# Patient Record
Sex: Male | Born: 1960 | Race: White | Hispanic: No | Marital: Married | Smoking: Current some day smoker
Health system: Southern US, Community
[De-identification: ages and names within clinical notes are randomized; demographics above are authoritative.]

## PROBLEM LIST (undated history)

## (undated) DIAGNOSIS — Z8711 Personal history of peptic ulcer disease: Secondary | ICD-10-CM

## (undated) DIAGNOSIS — I2510 Atherosclerotic heart disease of native coronary artery without angina pectoris: Secondary | ICD-10-CM

## (undated) DIAGNOSIS — E785 Hyperlipidemia, unspecified: Secondary | ICD-10-CM

## (undated) DIAGNOSIS — S3992XA Unspecified injury of lower back, initial encounter: Secondary | ICD-10-CM

## (undated) DIAGNOSIS — I10 Essential (primary) hypertension: Secondary | ICD-10-CM

## (undated) DIAGNOSIS — Z8719 Personal history of other diseases of the digestive system: Secondary | ICD-10-CM

## (undated) DIAGNOSIS — J449 Chronic obstructive pulmonary disease, unspecified: Secondary | ICD-10-CM

## (undated) DIAGNOSIS — R091 Pleurisy: Secondary | ICD-10-CM

## (undated) DIAGNOSIS — Z9889 Other specified postprocedural states: Secondary | ICD-10-CM

## (undated) DIAGNOSIS — I219 Acute myocardial infarction, unspecified: Secondary | ICD-10-CM

## (undated) DIAGNOSIS — M1990 Unspecified osteoarthritis, unspecified site: Secondary | ICD-10-CM

## (undated) DIAGNOSIS — Z955 Presence of coronary angioplasty implant and graft: Secondary | ICD-10-CM

## (undated) DIAGNOSIS — J439 Emphysema, unspecified: Secondary | ICD-10-CM

## (undated) HISTORY — DX: Atherosclerotic heart disease of native coronary artery without angina pectoris: I25.10

## (undated) HISTORY — PX: TOOTH EXTRACTION: SUR596

## (undated) HISTORY — PX: APPENDECTOMY: SHX54

## (undated) HISTORY — PX: TONSILLECTOMY: SUR1361

## (undated) HISTORY — PX: BACK SURGERY: SHX140

## (undated) HISTORY — DX: Unspecified injury of lower back, initial encounter: S39.92XA

## (undated) HISTORY — DX: Hyperlipidemia, unspecified: E78.5

## (undated) HISTORY — PX: COLONOSCOPY: SHX174

---

## 2005-05-20 DIAGNOSIS — Z955 Presence of coronary angioplasty implant and graft: Secondary | ICD-10-CM

## 2005-05-20 HISTORY — DX: Presence of coronary angioplasty implant and graft: Z95.5

## 2005-05-31 ENCOUNTER — Encounter (INDEPENDENT_AMBULATORY_CARE_PROVIDER_SITE_OTHER): Payer: Self-pay | Admitting: Specialist

## 2005-05-31 ENCOUNTER — Inpatient Hospital Stay (HOSPITAL_COMMUNITY): Admission: RE | Admit: 2005-05-31 | Discharge: 2005-06-02 | Payer: Self-pay | Admitting: Orthopedic Surgery

## 2005-09-26 ENCOUNTER — Emergency Department (HOSPITAL_COMMUNITY): Admission: EM | Admit: 2005-09-26 | Discharge: 2005-09-26 | Payer: Self-pay | Admitting: Emergency Medicine

## 2005-10-02 ENCOUNTER — Ambulatory Visit: Payer: Self-pay | Admitting: Cardiology

## 2005-10-02 ENCOUNTER — Inpatient Hospital Stay (HOSPITAL_COMMUNITY): Admission: EM | Admit: 2005-10-02 | Discharge: 2005-10-06 | Payer: Self-pay | Admitting: Cardiovascular Disease

## 2005-10-21 ENCOUNTER — Inpatient Hospital Stay (HOSPITAL_COMMUNITY): Admission: AD | Admit: 2005-10-21 | Discharge: 2005-10-23 | Payer: Self-pay | Admitting: Cardiology

## 2005-10-21 ENCOUNTER — Ambulatory Visit: Payer: Self-pay | Admitting: Cardiology

## 2005-11-05 ENCOUNTER — Ambulatory Visit: Payer: Self-pay | Admitting: Cardiology

## 2005-11-06 ENCOUNTER — Ambulatory Visit: Admission: RE | Admit: 2005-11-06 | Discharge: 2005-11-06 | Payer: Self-pay | Admitting: Gastroenterology

## 2005-11-06 ENCOUNTER — Ambulatory Visit: Payer: Self-pay | Admitting: Cardiology

## 2005-11-11 ENCOUNTER — Ambulatory Visit: Payer: Self-pay | Admitting: Gastroenterology

## 2005-11-25 ENCOUNTER — Ambulatory Visit: Payer: Self-pay | Admitting: Internal Medicine

## 2005-11-25 ENCOUNTER — Inpatient Hospital Stay (HOSPITAL_COMMUNITY): Admission: EM | Admit: 2005-11-25 | Discharge: 2005-11-26 | Payer: Self-pay | Admitting: Emergency Medicine

## 2006-01-21 ENCOUNTER — Ambulatory Visit: Payer: Self-pay

## 2006-02-06 ENCOUNTER — Ambulatory Visit: Payer: Self-pay | Admitting: Cardiology

## 2006-02-07 ENCOUNTER — Ambulatory Visit: Payer: Self-pay | Admitting: Cardiology

## 2006-02-21 ENCOUNTER — Ambulatory Visit (HOSPITAL_BASED_OUTPATIENT_CLINIC_OR_DEPARTMENT_OTHER): Admission: RE | Admit: 2006-02-21 | Discharge: 2006-02-21 | Payer: Self-pay | Admitting: *Deleted

## 2006-06-12 ENCOUNTER — Ambulatory Visit: Payer: Self-pay | Admitting: Cardiology

## 2006-07-21 ENCOUNTER — Ambulatory Visit (HOSPITAL_BASED_OUTPATIENT_CLINIC_OR_DEPARTMENT_OTHER): Admission: RE | Admit: 2006-07-21 | Discharge: 2006-07-21 | Payer: Self-pay | Admitting: Orthopaedic Surgery

## 2006-09-02 ENCOUNTER — Ambulatory Visit: Payer: Self-pay | Admitting: Cardiology

## 2006-09-02 LAB — CONVERTED CEMR LAB
Albumin: 3.8 g/dL (ref 3.5–5.2)
Alkaline Phosphatase: 65 units/L (ref 39–117)
LDL Cholesterol: 81 mg/dL (ref 0–99)
Total CHOL/HDL Ratio: 3.5
Total Protein: 7.1 g/dL (ref 6.0–8.3)
Triglycerides: 52 mg/dL (ref 0–149)

## 2006-10-09 ENCOUNTER — Inpatient Hospital Stay (HOSPITAL_COMMUNITY): Admission: RE | Admit: 2006-10-09 | Discharge: 2006-10-11 | Payer: Self-pay | Admitting: Orthopaedic Surgery

## 2006-12-19 ENCOUNTER — Ambulatory Visit: Payer: Self-pay | Admitting: Cardiology

## 2006-12-19 LAB — CONVERTED CEMR LAB
Basophils Absolute: 0 10*3/uL (ref 0.0–0.1)
MCV: 92.3 fL (ref 78.0–100.0)
Monocytes Absolute: 0.8 10*3/uL — ABNORMAL HIGH (ref 0.2–0.7)
Monocytes Relative: 7.7 % (ref 3.0–11.0)
Neutro Abs: 6.8 10*3/uL (ref 1.4–7.7)
Neutrophils Relative %: 65.1 % (ref 43.0–77.0)
RBC: 4.23 M/uL (ref 4.22–5.81)

## 2007-07-15 ENCOUNTER — Ambulatory Visit: Payer: Self-pay | Admitting: Cardiology

## 2007-07-17 ENCOUNTER — Ambulatory Visit: Payer: Self-pay | Admitting: Cardiology

## 2007-07-19 ENCOUNTER — Inpatient Hospital Stay (HOSPITAL_COMMUNITY): Admission: AD | Admit: 2007-07-19 | Discharge: 2007-07-21 | Payer: Self-pay | Admitting: Internal Medicine

## 2007-07-19 ENCOUNTER — Ambulatory Visit: Payer: Self-pay | Admitting: Internal Medicine

## 2007-09-28 ENCOUNTER — Ambulatory Visit: Payer: Self-pay | Admitting: Cardiology

## 2007-10-19 ENCOUNTER — Emergency Department (HOSPITAL_COMMUNITY): Admission: EM | Admit: 2007-10-19 | Discharge: 2007-10-19 | Payer: Self-pay | Admitting: Internal Medicine

## 2007-12-03 IMAGING — CR DG CHEST 1V PORT
1 series · 1 of 1 positions shown · non-contrast
Comparison: 09/26/2005.

CLINICAL DATA: Post-cath MI.
 PORTABLE CHEST, ONE VIEW ? 10/02/2005 ? (9857 HOURS):

[view not recorded]
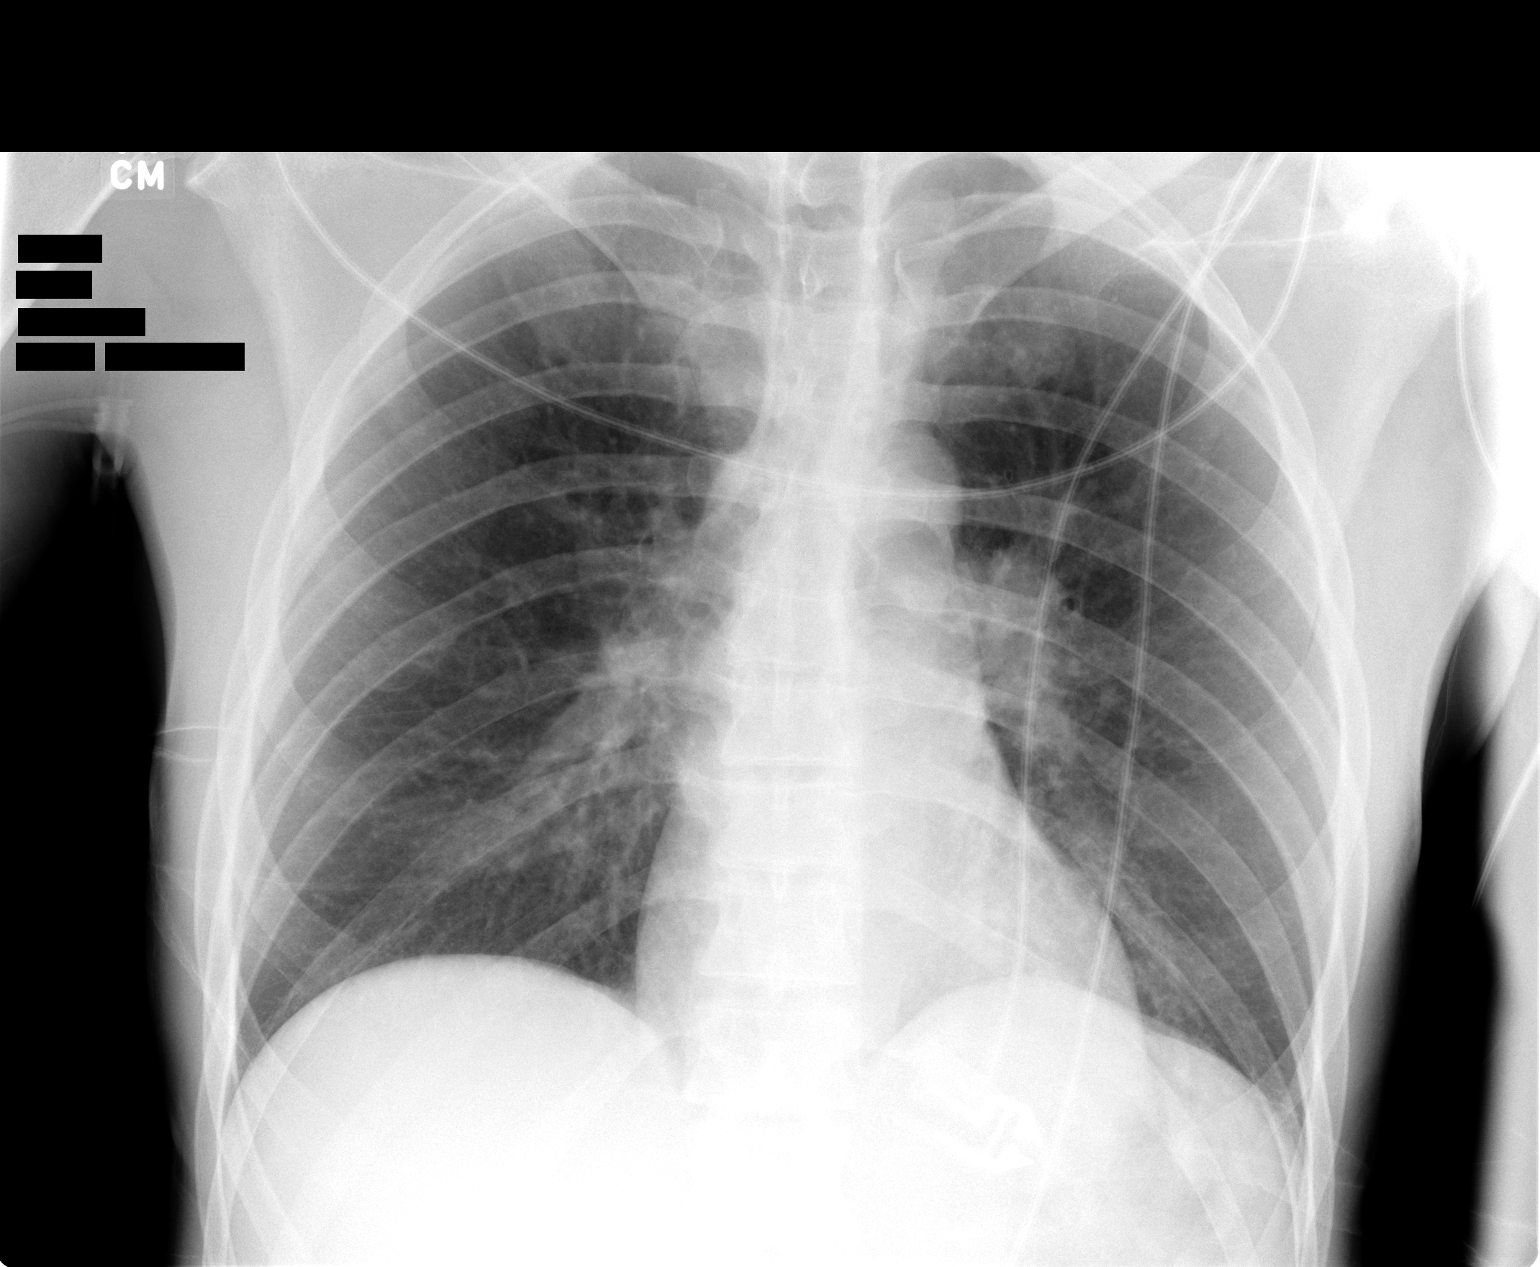

[1 of 1 positions shown; findings below may reference images not displayed]

FINDINGS: Portable view of the chest shows no active infiltrate or effusion.  The heart is within normal limits in size.  No bony abnormality is noted.
IMPRESSION: No active lung disease.

## 2008-10-25 ENCOUNTER — Telehealth: Payer: Self-pay | Admitting: Cardiology

## 2008-11-14 ENCOUNTER — Telehealth (INDEPENDENT_AMBULATORY_CARE_PROVIDER_SITE_OTHER): Payer: Self-pay | Admitting: *Deleted

## 2008-11-14 ENCOUNTER — Encounter (INDEPENDENT_AMBULATORY_CARE_PROVIDER_SITE_OTHER): Payer: Self-pay | Admitting: *Deleted

## 2008-12-09 DIAGNOSIS — F17200 Nicotine dependence, unspecified, uncomplicated: Secondary | ICD-10-CM

## 2008-12-09 DIAGNOSIS — S381XXA Crushing injury of abdomen, lower back, and pelvis, initial encounter: Secondary | ICD-10-CM

## 2008-12-09 DIAGNOSIS — I2510 Atherosclerotic heart disease of native coronary artery without angina pectoris: Secondary | ICD-10-CM

## 2008-12-09 DIAGNOSIS — E785 Hyperlipidemia, unspecified: Secondary | ICD-10-CM

## 2009-01-03 ENCOUNTER — Encounter: Payer: Self-pay | Admitting: Cardiology

## 2009-01-12 ENCOUNTER — Encounter: Payer: Self-pay | Admitting: Cardiology

## 2009-03-10 ENCOUNTER — Emergency Department (HOSPITAL_COMMUNITY): Admission: EM | Admit: 2009-03-10 | Discharge: 2009-03-11 | Payer: Self-pay | Admitting: Emergency Medicine

## 2009-04-21 ENCOUNTER — Telehealth: Payer: Self-pay | Admitting: Cardiology

## 2009-05-27 ENCOUNTER — Emergency Department (HOSPITAL_COMMUNITY): Admission: EM | Admit: 2009-05-27 | Discharge: 2009-05-27 | Payer: Self-pay | Admitting: Emergency Medicine

## 2009-07-10 ENCOUNTER — Telehealth: Payer: Self-pay | Admitting: Cardiology

## 2009-08-10 ENCOUNTER — Ambulatory Visit: Payer: Self-pay | Admitting: Cardiology

## 2009-09-21 ENCOUNTER — Telehealth: Payer: Self-pay | Admitting: Cardiology

## 2009-09-28 ENCOUNTER — Encounter: Payer: Self-pay | Admitting: Cardiology

## 2009-09-28 LAB — CONVERTED CEMR LAB
ALT: 17 units/L (ref 0–53)
AST: 24 units/L (ref 0–37)
Albumin: 3.9 g/dL (ref 3.5–5.2)
Alkaline Phosphatase: 59 units/L (ref 39–117)
Bilirubin, Direct: 0 mg/dL (ref 0.0–0.3)
Cholesterol: 143 mg/dL (ref 0–200)
HCT: 43.7 % (ref 39.0–52.0)
Hemoglobin: 14.7 g/dL (ref 13.0–17.0)
LDL Cholesterol: 87 mg/dL (ref 0–99)
Lymphocytes Relative: 39.1 % (ref 12.0–46.0)
MCHC: 33.6 g/dL (ref 30.0–36.0)
MCV: 95.1 fL (ref 78.0–100.0)
Platelets: 167 10*3/uL (ref 150.0–400.0)
RBC: 4.59 M/uL (ref 4.22–5.81)
Total Bilirubin: 0.6 mg/dL (ref 0.3–1.2)
Total CHOL/HDL Ratio: 4
Total Protein: 6.6 g/dL (ref 6.0–8.3)
WBC: 6.5 10*3/uL (ref 4.5–10.5)

## 2010-06-19 NOTE — Progress Notes (Signed)
Summary: meds from company  Phone Note Refill Request Message from:  Patient on order from company  Refills Requested: Medication #1:  PLAVIX 75 MG TABS 1 tablet by mouth once a day  Medication #2:  LIPITOR 40 MG TABS Take one tablet by mouth daily.  Medication #3:  NITROGLYCERIN 0.4 MG SUBL One tablet under tongue every 5 minutes as needed for chest pain---may repeat times three per pt it's ordered from company  Initial call taken by: Omer Jack,  Sep 21, 2009 1:11 PM  Follow-up for Phone Call        Plavix and Lipitor ordered from Assistance programs.  Lipitor and Plavix form will have to be completed again with the next order.  Medications will arrive in 7-10 days.  The pt's home number has been disconnected and the cell phone number is not clear (a lot of static). Julieta Gutting, RN, BSN  Sep 21, 2009 2:56 PM  The pt's medication arrived in the office.  I have attempted to reach the pt multiple times but the phone is out of the service area.  I will mail the pt a letter.  Julieta Gutting, RN, BSN  Sep 28, 2009 12:32 PM

## 2010-06-19 NOTE — Letter (Signed)
Summary: Results Follow-up  [facility], Main Office  [address]   [place], [place] [address]   Phone: [phone]  Fax: [phone]     Sep 28, 2009 MRN: [id number]    Dear [name],  We have received the results from your recent lab tests and have been unable to contact you. I have included a copy of your labwork.  Also, your medications have arrived in the office.  You will need to complete patient assistance forms for both Lipitor and Plavix  before obtaining further prescriptions. Please call the office at [phone] so that your medications can be placed at the front desk.   Thank you,  Julieta Gutting RN [facility]  Appended Document: Results Follow-up Letter was returned. Not deliverable as addressed unable to forward. ([address] [place], [place] [address])  Appended Document: Results Follow-up Britta Mccreedy called the office and asked if the pt's medications had arrived.  Both Lipitor and Plavix were placed at the front desk for pick-up.  Lipitor 40mg  PO# [id number], Plavix 75mg  Order #[id number].  I also placed new pt assistance forms at the front desk for the pt to complete and return to the office.

## 2010-06-19 NOTE — Progress Notes (Signed)
Summary: refill meds, plavix, lipitor  Phone Note Call from Patient Call back at Home Phone [phone] Call back at [phone]   Refills Requested: Medication #1:  PLAVIX 75 MG TABS 1 tablet by mouth once a day  Medication #2:  LIPITOR 40 MG TABS Take one tablet by mouth daily. Caller: [name] Reason for Call: Talk to Nurse Details for Reason: Per pt wife calling, pt get plavix, lipitor through the company, pt is out of meds.  Initial call taken by: Lorne Skeens,  July 10, 2009 12:37 PM  Follow-up for Phone Call        Lipitor ordered  ([employer] ID [id number], Order # [id number]) .  Pt is enrolled with [employer] until 01/11/10.  Plavix ordered.  Lipitor and Plavix will arrive in  the office in 7-10 business days.  The pt was last seen 09/28/2007.  The pt is scheduled to see Dr Riley Kill on 08/10/09.  I made Britta Mccreedy aware that this pt has to come for his appt or we cannot refill his medications in the future.   Follow-up by: Julieta Gutting, RN, BSN,  July 10, 2009 12:53 PM     Appended Document: refill meds, plavix, lipitor Plavix and Lipitor arrived. Plavix Order #[id number], [employer] PO #[id number]  Lot [id number], EXP NOV 2013.  Lipitor Customer #[id number]  Lot [id number], EXP Sept 2013.  Britta Mccreedy aware that medication has arrived and is at the front desk for pick-up.

## 2010-06-19 NOTE — Assessment & Plan Note (Signed)
Summary: ROV   Visit Type:  Follow-up  CC:  Chest pain- Headaches- Left arm pain.  History of Present Illness: Overall doing well.  Got his back surgery done, and did fine with all of that.  Not having any chest pain.  No pain with exertion.  Still working on the smoking.    Current Medications (verified): 1)  Metoprolol Tartrate 25 Mg Tabs (Metoprolol Tartrate) .... Take 1 Tablet By Mouth Twice A Day 2)  Plavix 75 Mg Tabs (Clopidogrel Bisulfate) .Marland Kitchen.. 1 Tablet By Mouth Once A Day 3)  Lipitor 40 Mg Tabs (Atorvastatin Calcium) .... Take One Tablet By Mouth Daily. 4)  Aspirin 81 Mg Tbec (Aspirin) .... Take One Tablet By Mouth Daily 5)  Zetia 10 Mg Tabs (Ezetimibe) .... Take One Tablet By Mouth Daily. 6)  Nitroglycerin 0.4 Mg Subl (Nitroglycerin) .... One Tablet Under Tongue Every 5 Minutes As Needed For Chest Pain---May Repeat Times Three 7)  Opana Er 7.5 Mg Xr12h-Tab (Oxymorphone Hcl) .... As Needed  Allergies: 1)  ! * Peppermint 2)  ! * Apples  Vital Signs:  Patient profile:   50 year old male Height:      72 inches Weight:      163.75 pounds BMI:     22.29 Pulse rate:   57 / minute Pulse rhythm:   regular Resp:     18 per minute BP sitting:   94 / 68  (left arm) Cuff size:   large  Vitals Entered By: Vikki Ports (August 10, 2009 12:59 PM)  Physical Exam  General:  Alert, oriented Head:  normocephalic and atraumatic Eyes:  PERRLA/EOM intact; conjunctiva and lids normal. Lungs:  Clear bilaterally to auscultation and percussion.  Somewhat prolonged expiration with mild ronchii. Heart:  PMI non displaced.  No murmur, rub, or gallop. Abdomen:  Bowel sounds positive; abdomen soft and non-tender without masses, organomegaly, or hernias noted. No hepatosplenomegaly. Pulses:  pulses normal in all 4 extremities Extremities:  No clubbing or cyanosis. Neurologic:  Alert and oriented x 3.   EKG  Procedure date:  08/10/2009  Findings:      SB, with sinus arrythmia, otherwise  normal ECG.  Impression & Recommendations:  Problem # 1:  CAD (ICD-414.00) Continues to remain stable on medical therpy. Has overlapping DES in totally occulded RCA that was open and patent at last cath.  Issues of DES discussed.  He is ok remaining on DAPT. His updated medication list for this problem includes:    Metoprolol Tartrate 25 Mg Tabs (Metoprolol tartrate) .Marland Kitchen... Take 1/2  tablet by mouth twice a day    Plavix 75 Mg Tabs (Clopidogrel bisulfate) .Marland Kitchen... 1 tablet by mouth once a day    Aspirin 81 Mg Tbec (Aspirin) .Marland Kitchen... Take one tablet by mouth daily    Nitroglycerin 0.4 Mg Subl (Nitroglycerin) ..... One tablet under tongue every 5 minutes as needed for chest pain---may repeat times three  Orders: EKG w/ Interpretation (93000) TLB-CBC Platelet - w/Differential (85025-CBCD) TLB-Lipid Panel (80061-LIPID) TLB-Hepatic/Liver Function Pnl (80076-HEPATIC)  Problem # 2:  HYPERLIPIDEMIA (ICD-272.4) Remains on multiple drugs for dyslipidemia.  Will get labs. His updated medication list for this problem includes:    Lipitor 40 Mg Tabs (Atorvastatin calcium) .Marland Kitchen... Take one tablet by mouth daily.    Zetia 10 Mg Tabs (Ezetimibe) .Marland Kitchen... Take one tablet by mouth daily.  Orders: EKG w/ Interpretation (93000) TLB-CBC Platelet - w/Differential (85025-CBCD) TLB-Lipid Panel (80061-LIPID) TLB-Hepatic/Liver Function Pnl (80076-HEPATIC)  Problem # 3:  TOBACCO  ABUSE (ICD-305.1) So far unable to quit.  Options reviewed and importance discussed with patient.  He understands issues.    Patient Instructions: 1)  Your physician recommends that you have lab work today:lipid, liver, cbc 2)  Your physician has recommended you make the following change in your medication: Decrease metoprolol to 1/2 tablet twice a day. 3)  Your physician wants you to follow-up in 1 year:   You will receive a reminder letter in the mail two months in advance. If you don't receive a letter, please call our office to schedule the  follow-up appointment.

## 2010-06-19 NOTE — Medication Information (Signed)
Summary: [employer] Patient Assistance Program letter   [employer] Patient Assistance Program letter   Imported By: Kassie Mends 07/10/2009 13:39:17  _____________________________________________________________________  External Attachment:    Type:   Image     Comment:   External Document

## 2010-09-05 ENCOUNTER — Ambulatory Visit: Payer: Self-pay | Admitting: Cardiology

## 2010-09-05 ENCOUNTER — Encounter: Payer: Self-pay | Admitting: Cardiology

## 2010-10-02 NOTE — Assessment & Plan Note (Signed)
[facility]                            CARDIOLOGY OFFICE NOTE   [name], [name]                     MRN:          [id number]  DATE:07/15/2007                            DOB:          09/20/1960    [name] is in for followup. In general, he has had some  intermittent chest discomfort which at times bothers him. In the interim  he continues to smoke, and he has stopped his lipid lower drugs. He is  using a cane currently.   MEDICATIONS:  1. Aspirin 81 mg daily.  2. Plavix 75 mg daily.  3. Lipitor 40 mg which he has run out of.  4. Lopressor 25 mg daily.  5. Robaxin 500 mg b.i.d.   His weight is 156, blood pressure 112/72, pulse is 60.  Lung fields are clear except for prolonged expiration.  Cardiac rhythm is regular.   Electrocardiogram  is entirely within normal limits.   [name] and I have discussed various options with regard to his  care. He may eventually need a cardiac catheterization. We have talked  about a plan of going ahead with this. If he has recurrent episodes of  pain, he is to contact us promptly. Given the fact that he has DES in  his RCA system, and a non DES in his LAD as well as modest disease in  the circumflex, this may require restudy. We will fill out his Lipitor  and Zetia assistance forms.     Arturo Morton. Riley Kill, MD, [facility]  Electronically Signed    TDS/MedQ  DD: 08/10/2007  DT: 08/10/2007  Job #: [id number]

## 2010-10-02 NOTE — Assessment & Plan Note (Signed)
[facility]                            CARDIOLOGY OFFICE NOTE   Tiny, Rietz Dondrell                     MRN:          [id number]  DATE:09/28/2007                            DOB:          [birth year]    Mr. [name] was in for follow-up.  From a clinical standpoint, he has  been stable.  His only down to smoking about one cigarette a week now.  He says that he has actually improved quite a lot from this standpoint.  He has continued to have problems with sciatic nerve damage, and he is  scheduled to have a TENS unit put in.   His current medications include:  1. Plavix 75 mg daily.  2. Aspirin 81 mg daily.  3. Lopressor 25 mg b.i.d.  4. Lipitor which he has stopped due to funds.  5. Zetia 10 mg daily.   On physical, he is alert and oriented in no distress.  Blood pressure  115/70, pulse 70.  LUNGS:  Fields clear.  CARDIAC:  Rhythm is regular.  No significant murmur.   IMPRESSION:  1. Advanced coronary artery disease status post two-vessel      percutaneous coronary intervention including a drug-eluting stent      in the right coronary artery with overlapping stents.  2. Hypercholesterolemia on lipid lowering therapy, although the      patient has recently stopped Lipitor.  3. Back injury.   PLAN:  The patient could stop Plavix if necessary in order to have the  TENS unit put in, but I would continue aspirin throughout the procedure.  He will return to see [place] in 3 months in follow-up.  Should he have any  problems in the interim, he is to call us.  I have continued to  encourage him not to smoke.     [name]. Riley Kill, MD, [facility]  Electronically Signed    TDS/MedQ  DD: 09/28/2007  DT: 09/28/2007  Job #: [id number]

## 2010-10-02 NOTE — Op Note (Signed)
NAMEWYNTER, GRAVE            ACCOUNT NO.:  [id number]   MEDICAL RECORD NO.:  [id number]          PATIENT TYPE:  INP   LOCATION:  2899                         FACILITY:  [facility]   PHYSICIAN:  Sharolyn Douglas, M.D.        DATE OF BIRTH:  Oct 11, 1960   DATE OF PROCEDURE:  10/09/2006  DATE OF DISCHARGE:                               OPERATIVE REPORT   [facility] DIAGNOSES:  1. 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  2. L5-S1 isthmic spondylolisthesis.  3. Post-laminectomy syndrome.  4. Lumbar degenerative disk disease.   POSTOPERATIVE DIAGNOSES:  1. 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  2. L5-S1 isthmic spondylolisthesis.  3. Post-laminectomy syndrome.  4. Lumbar degenerative disk disease.   PROCEDURES:  1. Revision L4-L5 and L5-S1 laminectomy with wide decompression of the      thecal sac and nerve roots bilaterally.  2. Posterior spinal fusion L4 through S1.  3. Segmental pedicle screw instrumentation L4 through S1 using the      Abbott spine system.  4. Transforaminal lumbar interbody fusion L4-L5 and L5-S1 with      placement of two PEEK cages, one and each level.  5. Local autogenous bone graft supplemented with bone morphogenic      protein and graft on allograft 15 mL.   SURGEON:  Sharolyn Douglas, M.D.   ASSISTANT:  Orlin Hilding, P.A.   ESTIMATED BLOOD LOSS:  400 mL.   COMPLICATIONS:  None.   NEEDLE AND SPONGE COUNT:  Correct.   INDICATIONS:  The patient is a pleasant 50 year old male with chronic  intractable back and bilateral lower extremity pain, right greater than  left.  He has had a previous laminectomy by another surgeon at L4-L5.  His imaging studies demonstrate a recurrent disk herniation at L4-L5  with probable L5 nerve root involvement.  In addition, he has an isthmic  grade 1 spondylolisthesis at L5-S1.  He has moderate degenerative  changes throughout the lumbar spine.  He has been refractory to all  conservative treatment modalities and now elects to undergo revision  decompression and fusion with instrumentation from L4 to S1.  He has a  cardiac stent in place, and his cardiologists have given the approval  for him to come off of his Plavix for his surgery.   PROCEDURE:  After informed consent, he was taken to the operating room.  He underwent general endotracheal anesthesia without difficulty.  He was  given prophylactic IV antibiotics.  Neuro monitoring was established in  the form of lower extremity EMGs and SSEPs.  He was turned prone onto  the Dawson frame.  All bony prominences padded.  Face and eyes protected  at all times.  Back prepped and draped in the usual sterile fashion.  The previous midline incision was utilized and extended several  centimeters proximally and distally.  Dissection was carried through the  previous scar and deep fascia.  Subperiosteal exposure carried out to  the tips of the transverse processes of L4 and L5 bilaterally as well as  sacral ala bilaterally.  Intraoperative x-ray was taken to confirm  the  level, and two retractors were placed.  We placed pedicle screws at L4,  L5, and S1 bilaterally using anatomic probing technique.  Each pedicle  hole was initiated with the awl.  The pedicles were then probed and  palpated with the ball-tip feeler.  There were no breeches.  Each  pedicle was then tapped and once again palpated with the ball feeler.  Again, there were no breeches.  We placed 6.5- x 50-mm screws in L4, 6.5-  x 45-mm screws in L5, and 7.5- x 40-mm screws at the sacrum bilaterally.  The screw purchase was good.  We then stimulated each pedicle screw  using triggered EMGs, and there were no deleterious changes.  Before  placing the pedicle screws, we had decorticated the transverse processes  of L4-L5 and the sacral ala bilaterally in preparation for the  posterolateral arthrodesis.  At this point, we turned our attention to  performing a  revision laminectomy.  The posterior arch of L5 was found  to be loose, consistent with the patient's diagnosis of pars fracture.  By using curettes, headlight, and loupe magnification, the edges of the  previous laminectomy were carefully defined, and the spinal canal was  entered.  This was accomplished starting at the inferior portion of the  laminectomy, carefully dissecting the edges off of the residual L5  lamina.  We then performed a similar procedure up under the L4 residual  lamina.  At this point, the Gill fragment was then removed in several  pieces.  The ED decompression was carried proximally up under the  residual L4 lamina.  There was quite a bit of scarring noted especially  at the L4-L5 segment, extending out into the lateral recess on the right  side.  The L5 and S1 nerve roots were identified and decompressed.  Once  we were satisfied with our decompression, we turned our attention to  performing transforaminal lumbar interbody fusions at L5-S1 and L4-L5 on  the right side.   We started by completing facette osteotomies at both levels, removing  the residual facette joints of L4-L5 and L5-S1.  At L4-L5 there was a  tremendous amount of scarring noted within the foramen and also in the  lateral recess.  The exiting and transversing nerve roots were  identified at each level.  Free running EMGs were monitored.  Starting  at L4-L5 we began the tedious process of completing a neurolysis around  the L5 nerve root which was adherent to the underlying disk space.  Once  the nerve root was mobilized, it was gently retracted medially.  The  disk space was then entered and dilated up to 9 mm.  Curved curettes  were used to complete a radical diskectomy and scrape the cartilaginous  endplates.  The disk space was packed with BMP sponges along with local  autogenous bone graft obtained from the laminectomy.  We then inserted a 9-mm PEEK cage which had been packed with a BMP sponge  into the  interspace and carefully tamped it anteriorly and across the midline.  We then performed a similar procedure at L5-S1.  At this level,  the  patient had a significant lordosis.  Once again, the disk space was  entered, and the cartilaginous endplates were scraped clean.  The disk  space was dilated up to 11 mm.  The disk space was packed with local  bone graft and the BMP sponges.  An 11-mm PEEK cage was used at L5-S1  and inserted  into the interspace and tamped anteriorly and across the  midline.  There were no changes in the EMGs throughout the __________  procedures.  Intraoperative x-ray was taken at this point showing good  position of the instrumentation at L4-L5 and L5-S1.   We turned our attention to completing the posterior spinal fusion by  packing the remaining local autogenous bone graft mixed with 15 mL of  Grafton allograft out over the transverse processes from L4 to S1.  We  then placed 70-mm titanium rods into the polyaxial screw, and  decompression was applied before shearing off the locking caps.  A cross  connector was placed.  Hemostasis was achieved.  Gelfoam was left over  the exposed epidural space.  Deep Hemovac drain left.  The deep fascia  closed with a running #1 Vicryl suture.  Subcutaneous layer closed with  2-0 Vicryl suture followed by a running 3-0 subcuticular Vicryl suture  on the skin edges.  Dermabond was applied.  Sterile dressing was placed.  The patient was turned supine and extubated without difficulty.  Transferred to recovery in stable condition, neurologically intact.   It should be noted my assistant, Orlin Hilding, PA, was present throughout  the procedure.  She assisted with positioning of the patient.  She then  assisted with the exposure using the Cobb elevators and suction.  She  then assisted with placement of the pedicle screws, again providing  retraction of the soft tissues and suction.  She assisted with the  decompression  using the __________  nerve root retractor and the  suction, carefully protecting the neural elements.  She assisted with  the arthrodesis, again by retraction and helping to place the bone  graft.  Finally, she assisted with wound closure.      Sharolyn Douglas, M.D.  Electronically Signed     MC/MEDQ  D:  10/09/2006  T:  10/09/2006  Job:  [id number]

## 2010-10-02 NOTE — Assessment & Plan Note (Signed)
[facility]                            CARDIOLOGY OFFICE NOTE   DAXTEN, KOVALENKO                   MRN:          [id number]  DATE:12/19/2006                            DOB:          [birth year]    Mr. Schauer is in for a followup visit.  In general, he is stable.  He has not been having any ongoing chest pain.  He actually ended up  having his back surgery.  He has continued to smoke but is somewhat  improved on this.  He denies chest pain.  His hematocrit dropped to  about 27 after his surgery but he has had no obvious symptoms.  He has  rare hemorrhoidal bleeding.  He remains on aspirin and Plavix.   CURRENT MEDICATIONS:  1. Enteric coated aspirin 81 mg daily.  2. Plavix 75 mg daily.  3. Lipitor 40 mg daily.  4. Hydrocodone q.4-6 h.  5. Lopressor 25 mg b.i.d.  6. Zetia 10 mg daily.  7. Robaxin 500 mg b.i.d.   PHYSICAL EXAMINATION:  VITAL SIGNS:  Blood pressure is 104/70, pulse 65.  No carotid bruits.  LUNGS:  Fields clear.  CARDIAC:  Rhythm is regular.  No murmur, rub, or gallop.   EKG reveals normal sinus rhythm within normal limits.   IMPRESSION:  1. Coronary disease, status post percutaneous coronary intervention      for multi-vessel coronary artery disease.  2. Hypercholesterolemia on lipid-lowering therapy.  3. Status post back surgery.  4. Continued tobacco use.   PLAN:  1. Return to clinic in 6 months.  2. Continue current medical regimen.  3. Recheck CBC.  4. Regarding Plavix, the patient has had overlapping stents.  As a      result of this, we likely are going to recommend a continued      medical therapy.     Arturo Morton. Riley Kill, MD, [facility]  Electronically Signed   TDS/MedQ  DD: 12/19/2006  DT: 12/19/2006  Job #: [id number]

## 2010-10-02 NOTE — Discharge Summary (Signed)
NAMEPERFECTO, PURDY            ACCOUNT NO.:  [id number]   MEDICAL RECORD NO.:  [id number]          PATIENT TYPE:  INP   LOCATION:  6531                         FACILITY:  [facility]   PHYSICIAN:  Arturo Morton. Riley Kill, MD, FACCDATE OF BIRTH:  [birth year]   DATE OF ADMISSION:  07/19/2007  DATE OF DISCHARGE:  07/21/2007                               DISCHARGE SUMMARY   PRIMARY CARDIOLOGIST:  Maisie Fus D. Riley Kill, M.D.   PRIMARY CARE PHYSICIAN:  Dr. Paulino Door through [facility].   PROCEDURES:  Procedures performed during hospitalization:  Cardiac  catheterization completed by Dr. Arvilla Meres on July 20, 2007  revealing three vessel coronary artery disease with patent left anterior  descending and right coronary artery stents, 80% diagonal 2 which is  stable. Ejection fraction of 50%.   FINAL DISCHARGE DIAGNOSES:  1. Chest pain, resolved.  2. Coronary artery disease, status post multiple percutaneous coronary      interventions with left anterior descending and right coronary      artery stents.      a.     Cardiac catheterization on July 20, 2007 revealing patent       stents with an 80% second diagonal and ejection fraction of 50%.  3. Chronic back pain with multiple surgeries.  4. Dyslipidemia.  5. Tobacco abuse.   HOSPITAL COURSE:  This is a 50 year old Caucasian male with known  coronary artery disease with stents to the right coronary artery and LAD  who presented to [facility] with complaints of chest pain which  he described as severe.  He had a CT scan of his chest which showed no  evidence of pulmonary emboli or dissection.  He had continued discomfort  in his chest despite multiple sets of negative cardiac markers and was  transferred to [facility] from [place] on July 19, 2007 for  cardiac catheterization.   The patient had cardiac enzymes cycled again throughout hospitalization  and were found to be negative.  The patient did undergo cardiac  catheterization physical examination Dr. Arvilla Meres on July 20, 2007 revealing patent stents in the right coronary artery and left  anterior descending with an 80% second diagonal which was stable from  prior cath.  The patient was also treated with proton pump inhibitor,  pain medication and continue his other medications as prior to  admission.  Also during hospitalization Lipitor was increased from 40 mg  a day to 80 mg a day.  The patient began to feel a good bit better and  was not having any more chest discomfort, stating he felt great and was  ready to go home.  The patient had some issues with medications  secondary to financial concerns secondary to disability for his back.  The patient has been given new prescriptions for Plavix along with  Lopressor, Lipitor, Vicodin and Zetia prior to discharge.   The patient was seen and examined and found to be stable.  There was no  evidence of bruits, hematoma or bleeding at the right groin site.  The  patient's EKG revealed normal sinus rhythm without evidence of ischemia  seen.  There was no ectopy seen during  hospitalization.  The patient  remained pain-free over the last 24 hours.  It was noted if the patient  had recurrent discomfort a GI consult would be obtained, however, this  was unnecessary as he had great improvement.  This may need to be  revisited as an outpatient if he has recurrent discomfort.   CURRENT LABORATORY DATA:  Sodium 140, potassium 3.7, chloride 108, CO2  28, BUN 11, creatinine 0.9, glucose 110, hemoglobin 13.0, hematocrit  38.0, white blood cell count 6.9, platelet count 184,000.   PHYSICAL EXAMINATION:  VITAL SIGNS:  Blood pressure 127/60, heart rate  55, respirations 17, oxygen saturation 98% on room air.  Temperature  97.8.   DISCHARGE MEDICATIONS:  1. Plavix 75 mg daily.  2. Aspirin 81 mg daily.  3. Lopressor 25 mg twice a day.  4. Lipitor 80 mg daily.  5. Zetia 10 mg daily.  6. Vicodin 10/325  p.r.n. q.6h. chest pain or discomfort.  7. Nitroglycerin 0.4 mg sublingual p.r.n. chest discomfort.   ALLERGIES:  Patient has no known drug allergies.   FOLLOWUP PLANS AND APPOINTMENTS:  1. The patient will be followed by Dr. Bonnee Quin in our office in      approximately three weeks to one month.  Office will call to make      that appointment.  2. The patient is to follow up with his primary care physician for      continued management and possible GI work up if he has recurrent      discomfort.  3. The patient has been given post cardiac catheterization      instructions with particular emphasis on the right groin site for      evidence of bleeding, hematoma, or signs of infection.   Time spent with the patient to include physician time, 35 minutes.      Bettey Mare. Lyman Bishop, NP      Arturo Morton. Riley Kill, MD, [facility]  Electronically Signed    KML/MEDQ  D:  07/21/2007  T:  07/21/2007  Job:  [id number]

## 2010-10-02 NOTE — Cardiovascular Report (Signed)
[name], [name]            ACCOUNT NO.:  [id number]   MEDICAL RECORD NO.:  [id number]          PATIENT TYPE:  INP   LOCATION:  2807                         FACILITY:  [facility]   PHYSICIAN:  Bevelyn Buckles. Bensimhon, MDDATE OF BIRTH:  1960/09/19   DATE OF PROCEDURE:  07/20/2007  DATE OF DISCHARGE:                            CARDIAC CATHETERIZATION   PATIENT INDICATIONS:  [name] is a 50 year old male with known  coronary artery disease, status post previous stenting of his right  coronary artery and LAD.  He was admitted to [facility] with a several-day  history of persistent severe chest pain.  He had a CT of the chest which  showed no evidence of pulmonary embolus and no dissection.  He continued  pain, despite multiple sets of negative cardiac markers.  He was  transferred for catheterization.  A bedside echo on arrival showed no  evidence of wall motion abnormalities.   PROCEDURES PERFORMED:  1. Selective coronary angiography.  2. Left heart cath.  3. Left ventriculogram.  4. Star Close femoral artery closure device.   DESCRIPTION OF PROCEDURE:  The risks and indications of the  catheterization was explained.  Consent was signed and placed on the  chart.  A 6-French arterial sheath was placed in the right femoral  artery, using a modified Seldinger technique.  Standard catheters  including a JL-4, JR-4 and angled pigtail were used for the procedure.  All catheter exchanges were made over wire.  There are no apparent  complications.  At the end of the procedure, the right femoral artery  was closed using a Star Close closure device.  There was good  hemostasis.   Central aortic pressure 116/71 with a mean of 90, LV pressure 111/5, an  EDP of 10.  There is no aortic stenosis.   Left main was normal.   LAD was a long vessel coursing to the apex, gave off 3 diagonals.  There  is a 30% ostial LAD lesion.  In the proximal to mid-section, there was a  long LAD stent.  This  was widely patent.  There was some mild narrowing  at the proximal portion of stent, about 20% to 30%.  Into the distal  LAD, there was mild diffuse disease with no flow-limiting lesions.  In  the ostium of the second diagonal, there was an 80% focal lesion where  the diagonal came out from the stented section, this was stable.  There  is good flow.  Stable from previous, there was good flow.   Left circumflex gave off tiny OM-1, a tiny OM-2, and a large OM-3.  There is a 30% ostial lesion of the circumflex and a 40% mid- lesion in  the AV groove circ.   Right coronary artery was a large dominant vessel, gave off an RV branch  acute marginal, a PDA and a posterolateral.  There was a long stented  area from the proximal through the midsection.  There was a 40% or 50%  focal lesion what appeared to be overlap site of the two stents,  although this was not clear.  There was a 30% lesion  more distally in  the stent.  There was some minor luminal irregularities in the distal  RCA.  There is no flow-limiting disease.   A left ventriculogram done the RAO position showed an ejection fraction  of approximately 50%.  There was some mild anterior hypokinesis.  There  is no significant mitral regurgitation.   ASSESSMENT:  1. Three-vessel nonobstructive coronary artery disease with patent LAD      and RCA stents, as described above.  2. Stable 80% lesion in the ostial of the second diagonal.  3. Left ventricular ejection fraction of 50%.   PLAN/DISCUSSION:  There is no evidence of significant flow-limiting  disease to explain his resting angina.  His enzymes have been negative.  I suspect his chest pain is not cardiac in nature.  We will watch him  overnight, and he should be suitable for discharge home in the morning.  If his pain recurs, consider GI evaluation, either as an inpatient or  outpatient.      Bevelyn Buckles. Bensimhon, MD  Electronically Signed     DRB/MEDQ  D:  07/20/2007  T:   07/20/2007  Job:  [id number]

## 2010-10-05 NOTE — Cardiovascular Report (Signed)
[name], [name]            ACCOUNT NO.:  [id number]   MEDICAL RECORD NO.:  [id number]          PATIENT TYPE:  INP   LOCATION:  2910                         FACILITY:  [facility]   PHYSICIAN:  Arturo Morton. Riley Kill, M.D. [facility] OF BIRTH:  11/05/1960   DATE OF PROCEDURE:  10/02/2005  DATE OF DISCHARGE:                              CARDIAC CATHETERIZATION   INDICATIONS:  [name] is a 50 year old gentleman who presents with  ongoing chest pain to the [facility].  With this he  continued to have pain and some ST elevation in V2, 3, V4.  He recently has  been in the [facility] Emergency Room where he was subsequently discharged.  He now presents with recurrent ischemic symptoms.  He was transferred  emergently for evaluation.  In the emergency room he was given enoxaparin,  aspirin, and Plavix.  Nitroglycerin was started.  He was transferred to the  catheterization laboratory directly.   PROCEDURE:  1.  Left heart catheterization.  2.  Selective coronary arteriography.  3.  Selective left ventriculography.  4.  PTCA and stenting of the left anterior descending artery.   DESCRIPTION OF THE PROCEDURE:  The patient was brought to the  catheterization laboratory and prepped and draped in usual fashion.  We were  very careful to prep the right groin.  Through an anterior puncture the  right femoral artery was entered and a 6-French sheath was easily placed.  Views of the left and right coronary arteries were obtained in multiple  angiographic projections.  Central aortic and left ventricular pressures  were measured with a pigtail.  Ventriculography was performed in the RAO  projection.  The patient had a subtotally occluded left anterior descending  artery that collateralized the distal right.  The right is segmentally and  diffusely diseased, but likely is a chronic finding with evidence of  competitive flow distally.  The circumflex is segmentally plaqued,  but  without critical high-grade focal obstruction.  With these findings it was  felt that stenting of the left anterior descending artery would be  appropriate.  Given the acute setting, we elected to place a non drug-  eluting stent.  The left anterior descending appeared to be the culprit  vessel.  With this we elected to give 2/3 of an appropriate dose of heparin.  ACT was checked and was just over 200.  The Lovenox had only been given as a  first dose and had only been received about one hour earlier.  Eptifibatide  was given according to protocol.  With this a JL3.5 guiding catheter was  utilized with a Research scientist (physical sciences).  Pre dilatation was done with a 2.5 x 12  Maverick balloon.  Following this the vessel was stented using a 2.75 x 18  Vision stent.  This was taken up to about 14 atmospheres and subsequently  post dilated with a 3.25 Quantum Maverick using 16 atmospheres of pressure  throughout the course of the stent.  Following this we did give doses of  both intracoronary nitroglycerin and verapamil.  There was marked  improvement in the appearance of the  artery.  There was some distal wire  spasm which was relieved after removal of the wire and pharmacologic  therapy.  The procedure was subsequently completed and the femoral sheath  was sewn into place.  ACT was rechecked and was 202 at the completion of the  procedure.  There were no complications.  There was also no bleeding at the  femoral site.   HEMODYNAMIC DATA.:  1.  Central aortic pressure 109/75, mean 91.  2.  Left ventricular pressure 106/14.  3.  No gradient on pullback across the aortic valve.   ANGIOGRAPHIC DATA:  1.  Ventriculography was done in the RAO projection.  The anteroapical      segment appeared to be hypokinetic with an estimated ejection fraction      in the range of about 40%.  2.  The left main coronary artery is free of critical disease with some mild      distal tapering.  3.  The circumflex  coronary artery demonstrates a long area of segmental      plaquing of about 50-60% in the junction of the proximal and mid vessel.      There is mild luminal irregularity at the ostium.  This terminates as a      large marginal branch.  There are subsequent several small marginal      branches proximally and other that are insignificant.  The distal      marginal is widely patent.  4.  The left anterior descending artery courses to the apex.  There appears      to be a ruptured plaque in the proximal vessel prior to the septal      perforator.  There is several small diagonal branches.  The LAD      demonstrates some diffuse distal spasm with the wire in place, but is      patent.  Following stenting the stenosis is reduced from 99% to 0% with      excellent TIMI 3 flow.  5.  The right coronary artery demonstrates diffuse segmental disease of 90%      or better in the proximal to mid vessel.  There is evidence of both      competitive distal flow to the posterolateral segment.  This suggests      chronic obstruction.   CONCLUSION:  1.  Moderate reduction of left ventricular function with wall motion      abnormality involving the left anterior descending territory.  2.  Diffuse chronic obstruction of the right coronary artery with left-to-      right collaterals.  3.  High-grade ruptured plaque in the proximal left anterior descending      artery with successful stenting using a non drug-eluting platform.   DISPOSITION:  The patient will likely be brought back to the laboratory  later this week for an attempt at opening the right coronary artery.  Change  in habits will be necessitated by his clinical situation.  Risk modification  will be recommended.      Arturo Morton. Riley Kill, M.D. [facility]  Electronically Signed     TDS/MEDQ  D:  10/02/2005  T:  10/02/2005  Job:  [id number]   cc:   CV Lab

## 2010-10-05 NOTE — Op Note (Signed)
[name], [name]            ACCOUNT NO.:  [id number]   MEDICAL RECORD NO.:  [id number]          PATIENT TYPE:  INP   LOCATION:  5009                         FACILITY:  [facility]   PHYSICIAN:  Nelda Severe, MD      DATE OF BIRTH:  Jan 01, 1961   DATE OF PROCEDURE:  05/31/2005  DATE OF DISCHARGE:                                 OPERATIVE REPORT   SURGEON:  Nelda Severe, MD   ASSISTANT:  Sherril Croon, RN, [facility]   PREOPERATIVE DIAGNOSIS:  Lumbar spinal stenosis and disk degeneration at L4-  5.   POSTOPERATIVE DIAGNOSIS:  Lumbar spinal stenosis and disk degeneration at L4-  5, plus small central disk protrusion with annular tear.   OPERATION:  Bilateral L4-5 laminectomy, lateral recess decompression, and  right L4-5 disk excision.   OPERATIVE NOTE:  The patient was placed under general endotracheal  anesthesia. The Foley catheter was not placed. He had been given 1 gm of  vancomycin, intravenously for prophylaxis. Bilateral sequential compression  devices were in place. He was positioned prone on the [place] spinal frame.  Care was taken to position the upper extremities so as to avoid hyperflexion  and hyperabduction of the shoulders and so as to avoid hyperflexion of the  elbows. The upper extremities were padded with foam from axilla to hands.  There was no pressure on the cubital tunnels. The hips and knees were gently  flexed and padded with pillows. Care was taken to make sure that there was  no pressure on his genitals. The lumbar area was prepped with DuraPrep and  draped in rectangular fashion. Prior to prepping, a skin pen had been used  to mark the site incision at what was clinically the L4-5 level. After  preparation with DuraPrep, rectangular drapes were applied and secured with  Ioban.   A short midline incision was made over what was perceived to be the L4-5  level and which proved to be subsequently when a cross-table lateral  radiograph was taken with a Kocher  on the trailing edge of the L4 lamina.  Paraspinal muscles were mobilized bilaterally to the facette joints of L4-5;  and the soft tissue cleaned off the lamina of L4 above, and the lamina of L5  below, as well as the facette joints. The angle Carlen curette was used to  separate the ligamentum flavum from the trailing edge of the L4 lamina and  medial edge of the facette joints bilaterally; and ultimately from its  insertion into the upper lamina of L5 bilaterally.  A high-speed bur was  then used to perform a medial inferior facetectomy on both sides and to thin  down the L4 lamina,. The lower end of the spinous process of L4 and upper  end of L5 spinous process were removed with a rongeur.   Then, prior to removing the ligamentum flavum a lateral recess decompression  was carried out using a 3-mm Kerrison to remove the medial edge of the  superior articular process on both sides. The laminectomy was then extended  proximally into the lamina of L4 bilaterally. Subsequently, as noted above,  the insertion of the ligamentum flavum into the upper edge of the L5 lamina  was freed from the lamina using Carlen curettes. The ligamentum flavum was  then removed in several large pieces, in some instances using a Kerrison  rongeur and in some a curette laterally. The dura was then exposed. It was  noted that the fifth lumbar nerve root on the right side was extremely  large, probably 50% bigger than that on the left side. It was reasonably  well decompressed. By the time that it had been visualized, we did further  decompress it distally, to a point medial to the pedicle of L5 on the right  side so that we were absolutely sure it was not being compressed as it  descended and coursed laterally around the pedicle.   There was a fair amount of epidural ooze which was controlled with a  combination of bipolar coagulation. Gelfoam temporally placed and FloSeal.  I retracted the origins of the L5 nerve  root on the right side and palpated  the disk with a #4 Penfield. The disk was very patulous, and I had the  impression that the annulus was not intact deep to the posterior  longitudinal ligament and outermost fibers. The cruciate incision was made  in the annulus and a small amount nucleus proceeded to exude into the  defect. I probed the disk, and in fact there was an annular defect; and  there was not a huge amount of nucleus pulposus which was actually  herniating. Epstein down-pushing curettes were used to loosen up some  annulus in the posterior portion of the disk; and this was extricated using  pituitary rongeurs. No large fragments were delivered.   The spinal canal was palpated with a ball-tipped nerve hook; and no free  fragments could be identified and the fifth lumbar nerve root on the right  side, and on the left also were completely and well decompressed. The bone  surfaces created by the laminectomy which were oozing some blood were waxed  with bone wax. Further control of the epidural bleeding was done with  bipolar coagulation. The Gelfoam was removed. A small amount of FloSeal was  injected dorsal to the laminectomy. Although there was very little bleeding,  there had been a fair amount of oozing during the surgery; and for this  reason, I decided to place a 1/8-inch Hemovac drain in the deep portion of  the wound prior to closing, anticipating this will be removed tomorrow  morning. The Hemovac drain was secured with a 2-0 nylon suture in basket-  weave fashion. The lumbar fascia was closed using interrupted figure-of-  eight #0 Vicryl sutures, the subcutaneous tissue was closed using continuous  2-0 Vicryl suture, and the skin was closed using continuous subcuticular 3-0  undyed Vicryl suture. The skin edges were reinforced with Steri-Strips. A nonadherent antibiotic ointment dressing was applied and secured with  OpSite.   The patient was awakened, placed in his  bed and extubated. In the recovery  room he can actively move both feet and legs; and reports that he has no  right leg pain.  Sponge and needle counts were correct. There were no  intraoperative complications. Blood loss estimated at 150 mL.      Nelda Severe, MD  Electronically Signed     MT/MEDQ  D:  05/31/2005  T:  06/01/2005  Job:  [id number]

## 2010-10-05 NOTE — Cardiovascular Report (Signed)
[name], [name]            ACCOUNT NO.:  [id number]   MEDICAL RECORD NO.:  [id number]          PATIENT TYPE:  INP   LOCATION:  3735                         FACILITY:  [facility]   PHYSICIAN:  Arturo Morton. Riley Kill, M.D. [facility] OF BIRTH:  Jan 19, 1961   DATE OF PROCEDURE:  10/22/2005  DATE OF DISCHARGE:                              CARDIAC CATHETERIZATION   INDICATIONS:  [name] is a 50 year old gentleman who presented with  an acute coronary syndrome.  He had stenting of the left anterior descending  artery and then elective stenting of the right coronary.  The right coronary  was difficult, and Dr. Samule Ohm nicely placed two drug-eluting stents in  overlapping fashion.  The patient was discharged from the hospital and did  not get his prescription for Plavix.  He presented with some recurrent chest  pain and was admitted for repeat evaluation.   PROCEDURE:  1.  Left heart catheterization.  2.  Selective arteriography.  3.  Selective left ventriculography.   DESCRIPTION OF PROCEDURE:  The patient was brought to the catheterization  laboratory and prepped and draped in usual fashion.  Through an anterior  puncture, right femoral artery was easily entered.  A 6-French sheath was  placed.  Views of the right and left coronaries were obtained in multiple  angiographic projections.  Central, aortic and left ventricular pressures  were measured with a the pigtail.  Ventriculography was performed in the RAO  projection.  He tolerated the procedure without complication and was taken  the holding area in satisfactory clinical condition.  The sheath was sewn  into place because of the ACT.   HEMODYNAMIC DATA.:  1.  Central aortic pressure 103/66.  2.  Left ventricular pressure 106/15.  3.  No gradient pullback across aortic valve.   ANGIOGRAPHIC DATA.:  1.  Ventriculography was done in the RAO projection.  There was hypokinesis      noted at the apical tip.  Ejection fraction  overall was about 50% no      definite wall motion abnormalities were seen.  2.  The left main coronary artery is free of critical disease.  3.  The left anterior descending artery demonstrates about 30% narrowing of      the ostium which is similar to the previous study.  The area of the      previously ruptured plaque with a stent placed in this area demonstrates      no evidence of significant restenosis.  The vessel courses to the apex.      There is TIMI 3 flow.  There is a first diagonal branch which is      unchanged from previous study, and there is about 70% narrowing of this      tiny diagonal.  4.  The circumflex appears improved from the acute study.  There was about      60% narrowing previously, but this looks less severe on the current      study with about 40% narrowing.  The distal vessel was moderate in size.  5.  The right coronary artery is widely patent in the  area of previously      diffusely diseased reconstituted vessel.  Posterior descending and      posterolateral branch are without critical narrowing.   CONCLUSION:  1.  Preserved overall left ventricular function with mild anteroapical wall      motion abnormality.  2.  Continued patency of the LAD stent with about 70% ostial narrowing of a      small diagonal.  3.  Forty percent narrowing in the mid circumflex which is improved from the      acute study.  4.  Continued patency of the RCA stents.   DISPOSITION:  This is a difficult social situation.  At the present time, I  plan to have the patient continue on Plavix if he can get.  We had tried to  make arrangements, and we gave him a card to request Plavix.      Arturo Morton. Riley Kill, M.D. [facility]  Electronically Signed     TDS/MEDQ  D:  10/22/2005  T:  10/23/2005  Job:  [id number]

## 2010-10-05 NOTE — Cardiovascular Report (Signed)
[name], [name]            ACCOUNT NO.:  [id number]   MEDICAL RECORD NO.:  [id number]          PATIENT TYPE:  INP   LOCATION:  2910                         FACILITY:  [facility]   PHYSICIAN:  Salvadore Farber, M.D. [facility] OF BIRTH:  01-Feb-1961   DATE OF PROCEDURE:  10/05/2005  DATE OF DISCHARGE:                              CARDIAC CATHETERIZATION   PROCEDURE:  Drug-eluting stent placement times two in the proximal and mid  right coronary artery, intravascular ultrasound of the right coronary  artery, and StarClose closure of both the right and left common femoral  arteriotomy sites.   INDICATION:  [name] is a 50 year old gentleman who presented with  non-ST-elevation myocardial infarction on May 16th. He underwent stenting of  the mid LAD. At that time he was noted to have what was felt to be either a  subtotal occlusion of the RCA or a long segment chronic total occlusion with  bridging collaterals. Since the patient had had class III stable angina in  the months prior to presentation, I was asked to perform a percutaneous  revascularization of this right coronary. He returns to the lab for that  procedure today.   PROCEDURAL TECHNIQUE:  Informed consent was obtained. Under 1% lidocaine  local anesthesia, a 7-French sheath was placed in the right common femoral  artery using a modified Seldinger technique. Anticoagulation was initiated  with Angiomax. ACT was maintained at greater than 225 seconds. A 7-French  JR4 guiding catheter was advanced over the wire and engaged in the ostium of  the right coronary artery. Using a 2.0 x 15-mm over the wire Maverick  balloon as backup for support, I was able to advance a Whisper wire across  the vast majority of this stenosis. However, approximately 10 mm from the  point of reconstitution I was unable to advance the Whisper wire any  further. I then switched out for a 4.5 Miracle Brothers wire. This I was  able to  advance  further, but was unable to re-access the true lumen. With  the balloon in place, I had difficulty visualizing the point of  reconstitution due to diminishment of the bridging collaterals by the  balloon.   To see the point of re-constitution better, I decided to access the left  coronary system to fill left to right collaterals. I, therefore, placed a 5-  French sheath in the left common femoral artery using the modified Seldinger  technique. I advanced a 5-French JL4 diagnostic catheter over the wire and  engaging the ostium of the left main. Via injections through this left  coronary catheter I was able to see the collaterals to the right and see the  point of reconstitution more clearly. I then switched up to a Miracle  Brothers 12 wire. I remained unable to access the true lumen. With this wire  in place, I took an additional Miracle Brothers 12 wire and oriented it in a  different direction at the point of re-constitution. With this, I was able  to access the true lumen. However, due to stiffness of the wire I was not  able to advance  it beyond this point. I advanced balloon catheter up to this  level and then removed a wire. I was then able to very easily advance a  Whisper wire into the distal vasculature. The wire clearly moved freely  within the lumen. I advanced the balloon catheter over this wire far distal  to the point of total occlusion. I then removed the wire and injected  contrast via the balloon lumen, confirming intraluminal position of the  wire. I then proceeded to dilate the entire segment at what was the total  occlusion using the 2.0 x 15 Maverick for three inflations at four  atmospheres distally and eight atmospheres proximally.  I then proceeded to  stent the distal portion of the lesion using a 2.5 x 28-mm CYPHER deployed  at 14 atmospheres. I then positioned a second CYPHER overlapping the  proximal portion of the stent to cover the remainder of the lesion.  The  second 2.5 x 28-mm CYPHER was also deployed at 14 atmospheres. Before  deployment of the first stent, there was a linear radiolucency extending  distally from the site where we had treated. This raised concern for a flow  artifact versus thrombus on the wire versus lengthy dissection. ACT was  confirmed as therapeutic. After placing the stents, I assessed it with  intravascular ultrasound. This showed no evidence of dissection. There was  what appeared to be either soft plaque or thrombus. This was not occlusive  and flow remained TIMI-III. With the question of thrombus I decided to give  a bail out glycoprotein IIb/IIIa inhibitor (double bolus eptifibatide). I  then post dilated both stents using a 2.5 x 20-mm Quantum for a total of  four inflations. It was inflated to 14 atmospheres distally, 18 atmospheres  at the region of overlap of the two stents, and 16 atmospheres elsewhere.  Final angiography demonstrated no residual stenosis, no dissection, and TIMI-  III flow to the distal vasculature. The linear radiolucency distal to the  stents had completely resolved.   Both arteriotomies were then closed using StarClose devices. Complete  hemostasis was obtained. The patient was then transferred to the cardiac  intensive care unit in stable condition.   COMPLICATIONS:  None.   IMPRESSION/RECOMMENDATIONS:  Successful drug-eluting stent placement across  the long segment, chronic total occlusion of the right coronary artery. The  patient should be maintained on aspirin and Plavix indefinitely. Smoking  cessation was again strongly advised.      Salvadore Farber, M.D. [facility]  Electronically Signed     WED/MEDQ  D:  10/04/2005  T:  10/05/2005  Job:  [id number]

## 2010-10-05 NOTE — Discharge Summary (Signed)
NAMENUMAIR, MASDEN            ACCOUNT NO.:  [id number]   MEDICAL RECORD NO.:  [id number]          PATIENT TYPE:  INP   LOCATION:  2910                         [facility]ACILITY[facility]  [facility]   PHYSICIAN:  April Humphrey, NP     DATE OF BIRTH:  1960-07-27   DATE OF ADMISSION:  10/02/2005  DATE OF DISCHARGE:  10/06/2005                                 DISCHARGE SUMMARY   ADDENDUM:  The patient was okayed for discharge yesterday by Dr. Eden Emms;  however, the patient developed bleeding at his groin site and it was decided  to monitor the patient overnight and evaluate the patient in the morning to  be discharged home.  Dr. Eden Emms in to see and evaluate the patient.  The  groin had stopped bleeding and Dr. Eden Emms had okayed the patient for  discharge home.           ______________________________  April Humphrey, NP     AH/MEDQ  D:  10/06/2005  T:  10/07/2005  Job:  [id number]

## 2010-10-05 NOTE — Discharge Summary (Signed)
NAMECLIFTON, [name]            ACCOUNT NO.:  [id number]   MEDICAL RECORD NO.:  [id number]          PATIENT TYPE:  INP   LOCATION:  3735                         FACILITY:  [facility]   PHYSICIAN:  Arturo Morton. Riley Kill, M.D. [facility] OF BIRTH:  May 30, 1960   DATE OF ADMISSION:  10/21/2005  DATE OF DISCHARGE:  10/23/2005                                 DISCHARGE SUMMARY   PRIMARY CARDIOLOGIST:  Maisie Fus D. Riley Kill, M.D.   DISCHARGING DIAGNOSIS:  1.  Chest pain in the setting of known coronary artery disease status post      cardiac catheterization on October 22, 2005 showing patent stents.  2.  Medical noncompliance secondary to financial issues/currently out of      work secondary to [employer].   PAST MEDICAL HISTORY:  1.  Coronary artery disease status post anterior ST elevated myocardial      infarction on Oct 02, 2005.  2.  Status post laminectomy.  3.  Status post appendectomy.   PROCEDURES THIS ADMISSION:  Cardiac catheterization.   HOSPITAL COURSE:  [name] is a 50 year old male with a history of  coronary artery disease status post recent ST elevated MI treated with a  bare metal stent to the LAD.  At the time of catheterization, he was noted  to have subtotally occluded right coronary artery and underwent Cypher drug-  eluting stent x2 to the RCA on Oct 05, 2005.  He had residual coronary  artery disease including a 50-60% lesion in the proximal __________  circumflex.  EF was reduced to 40% with anterior apical hypokinesis.  The  patient presented initially to our tertiary office on the day of admission  for follow up post hospitalization, the patient stating he had not been able  to afford his Plavix and had not been on it since he was discharged 2 weeks  ago.  He had also not been taking his Lipitor.  The initial  electrocardiogram showed sinus rhythm with a heart rate of 55 and no acute  changes.  The patient was admitted to [facility] for repeat evaluation  of  stent secondary to medical noncompliance.  We also cycled cardiac enzymes to  rule out myocardial infarction.  The patient was taken total he  catheterization laboratory on October 22, 2005.  The ejection fraction was found  to be around 50% at that time.  The patient had patent stents.  He is being  discharged home status post cardiac catheterization with a long discussion  regarding the importance of his medication regimen.  I have done quite a bit  of research in trying to assist the patient with his medications.  I have  given him two 14-day supply cards of Plavix, including prescriptions that he  can get filled at any pharmacy.  I have also filled out the [employer]  Plavix resource application for the patient.  He just needs to fill in his  information and mail the application to the company, along with a  prescription which I have provided.  I have also asked case management to  assist him with initial Plavix for the next  few days.  He can get his  Lopressor at [facility], a 30-day supply for $15.00.  He can also get generic  simvastatin, a 90-day supply at [facility] for $15.00.  I have written him  prescriptions for both of these.  He can continue on over-the-counter  aspirin, Plavix 75 daily, metoprolol 25 mg t.i.d. and generic Zocor 40 mg  daily.  I have given him the post cardiac catheterization discharge  instructions.  He also has a prescription for nitroglycerin for chest  discomfort.   FOLLOWUP:  He is to follow up with Dr. Riley Kill on November 05, 2005 at 4:30.  If  he has any problems prior to this, he is to call our office.   DURATION OF DISCHARGE ENCOUNTER:  60 minutes.      Dorian Pod, NP      Arturo Morton. Riley Kill, M.D. [facility]  Electronically Signed    MB/MEDQ  D:  10/23/2005  T:  10/23/2005  Job:  [id number]

## 2010-10-05 NOTE — Assessment & Plan Note (Signed)
[facility]                            CARDIOLOGY OFFICE NOTE   AIDIAN, SALOMON                   MRN:          [id number]  DATE:09/02/2006                            DOB:          10-01-1960    Mr. [name] is in for a follow-up visit.  To briefly summarize, he  continues to have sciatic discomfort in his right hip.  He apparently is  going to potentially have surgery soon.  He saw [name] as a second  opinion, specifically in regard to whether or not he could have surgery,  and the opinion was virtually the same.  Specifically, we had  recommended that the patient could have surgery, but the Plavix should  be held for five days prior.  Based on the acuity and need for surgery,  the preference is to defer surgery for approximately one year, but if it  needed to be done, it could be done in a shorter time period.  Dr.  [place] and I were both concerned specifically about discontinuation of  both antiplatelet agents in that the patient has overlapping drug-  eluting stents in the right coronary artery.  Likewise, this would be in  conflict with the ACC position taper, and the majority of our surgical  colleagues have been operating on aspirin alone with a five day  discontinuation period of Plavix.  The patient has not been having any  significant chest pain.  He has not been having any rectal bleeding as  well.   PHYSICAL EXAMINATION:  VITAL SIGNS:  The weight is 154 pounds, blood  pressure 110/80, pulse 53.  LUNGS:  Lung fields are clear.  CARDIAC:  The cardiac rhythm is regular without murmur, rub or gallop.   The electrocardiogram demonstrates normal sinus rhythm/sinus bradycardia  with incomplete right bundle branch block.   IMPRESSION:  1. Coronary artery disease with prior acute myocardial infarction with      stent in the left anterior descending artery and overlapping drug-      eluting stents in the right coronary artery.  2. Hypercholesterolemia.  3. Need for back surgery.   PLAN:  1. Lipid and liver profile.  2. The patient is nearing his one-year anniversary.  Discussions have      been held with the possibility of surgery.  We would recommend      approximately 5-7 days of Plavix discontinuation prior to the      surgery with the hope that aspirin could be continued through      surgery.  3. I strongly encouraged the patient to stop smoking.  This will be a      long-term ongoing issue for him.  We have counseled him on this      repeatedly.     Arturo Morton. Riley Kill, MD, [facility]  Electronically Signed   TDS/MedQ  DD: 09/02/2006  DT: 09/02/2006  Job #: [id number]

## 2010-10-05 NOTE — H&P (Signed)
NAMECOLBIE, DANNER            ACCOUNT NO.:  [id number]   MEDICAL RECORD NO.:  [id number]          PATIENT TYPE:  INP   LOCATION:  3735                         FACILITY:  [facility]   PHYSICIAN:  Arturo Morton. Riley Kill, M.D. [facility] OF BIRTH:  June 25, 1960   DATE OF ADMISSION:  10/21/2005  DATE OF DISCHARGE:                                HISTORY & PHYSICAL   PRIMARY CARE PHYSICIAN:  None.   PRIMARY CARDIOLOGIST:  Arturo Morton. Riley Kill, M.D.   CHIEF COMPLAINT:  Chest pain.   HISTORY OF PRESENT ILLNESS:  Mr. Batalla is a very pleasant 50 year old  male patient with history of coronary artery disease status post anterior ST-  elevation myocardial infarction Oct 02, 2005, treated with a bare metal  stent to the LAD.  At the time of catheterization, he was noted to have a  subtotally occluded RCA and underwent Cypher drug-eluting stent x2 placement  to the RCA on Oct 05, 2005, by Dr. Samule Ohm.  His residual CAD included a 50-  60% lesion in the proximal to mid circumflex.  His EF was reduced to 40%  with anteroapical hypokinesis.  He presents to the office today for followup  post hospitalization.  Unfortunately, he was unable to afford his Plavix and  has not been on it since discharge 2 weeks ago.  He has also not been on his  Lipitor.  He notes exertional chest burning, denies any rest pain.  He  denies any radiation.  He does note some shortness of breath with exertion.  Denies any diaphoresis but has noted some nausea.  Denies any syncope or  presyncope.  He again denies any rest symptoms.  Denies any orthopnea or  paroxysmal nocturnal dyspnea.   PAST MEDICAL HISTORY:  As noted above, significant for coronary artery  disease status post anterior ST-elevation myocardial infarction Oct 02, 2005.  He is status post laminectomy and appendectomy.  He denies any  history of diabetes mellitus or hypertension.  He has dyslipidemia and is  currently not on any treatment.   MEDICATIONS:  1.   Celebrex 200 mg daily.  2.  Flexeril 10 mg twice daily.  3.  Aspirin 325 mg daily.  4.  Hydrocodone 10/500 mg p.r.n.   ALLERGIES:  No known drug allergies.  He admits allergies to PEPPERMINT and  APPLES.   SOCIAL HISTORY:  He quit smoking at the time of his heart attack.  He is  currently out of work on a [employer].  He is engaged.   FAMILY HISTORY:  Significant for coronary disease.  His father died at age  83 with myocardial infarction.   REVIEW OF SYSTEMS:  Please see HPI.  Denies any melena, hematochezia,  hematuria, dysuria, fever, chills, cough.  Denies any symptoms consistent  with amaurosis fugax or TIAs.  Rest of Review of Systems are negative.   PHYSICAL EXAMINATION:  GENERAL: He is well-nourished, well-developed, not in  acute distress.  VITAL SIGNS: Blood pressure 120/82, pulse 63.  Weight 148 pounds.  HEENT:  Head normocephalic and atraumatic.  Eyes: PERRLA.  EOMI.  Sclerae  clear.  NECK:  Without lymphadenopathy.  ENDOCRINE:  Without thyromegaly.  CARDIAC: S1, S2. Regular rate and rhythm without murmurs.  LUNGS: Clear to auscultation bilaterally without wheeze, rhonchi, or rales.  ABDOMEN: Soft, nontender with normoactive bowel sounds.  No organomegaly.  EXTREMITIES:  Without edema.  PULSES:  Carotids 2+ bilaterally without bruits.  Femoral pulses are 2+  bilaterally without bruits.  Femoral artery sites are without hematoma  bilaterally.  NEUROLOGIC: Nonfocal. He is alert and oriented x3.  SKIN: Warm and dry.   Electrocardiogram reveals sinus rhythm with heart rate of 65, normal axis,  no acute changes.   IMPRESSION:  1.  Unstable angina pectoris.  2.  Coronary artery disease.      1.  Status post anterior ST-elevation myocardial infarction May 16 with          bare metal stent to the left anterior descending.      2.  Status post staged intervention on the right coronary artery with          drug-eluting stent x2 (Cypher) on Oct 05, 2005.       3.  Residual circumflex disease of 50-60% proximal and mid.  3.  Ischemic cardiomyopathy of 40%.  4.  Untreated dyslipidemia.  5.  Status post laminectomy.  6.  Ex-smoker.  7.  Family history of coronary artery disease.   PLAN:  The patient was also seen and reviewed by Dr. Riley Kill today.  We plan  to admit him directly to [facility] from the office today.  We will  place him on heparin, aspirin, load him with Plavix, and place him back on  daily Plavix.  We will check serial enzymes to rule out myocardial  infarction.  We suggest relook catheterization tomorrow to ensure patency of  his stents.  He will require social work to see him to help with  prescription assistance.      Tereso Newcomer, P.A.      Arturo Morton. Riley Kill, M.D. [facility]  Electronically Signed    SW/MEDQ  D:  10/21/2005  T:  10/21/2005  Job:  [id number]

## 2010-10-05 NOTE — H&P (Signed)
[name], [name]            ACCOUNT NO.:  [id number]   MEDICAL RECORD NO.:  [id number]          PATIENT TYPE:  INP   LOCATION:  A216                          FACILITY:  [facility]   PHYSICIAN:  Kingsley Callander. Ouida Sills, MD       DATE OF BIRTH:  20-Jun-1960   DATE OF ADMISSION:  DATE OF DISCHARGE:  LH                                HISTORY & PHYSICAL   CHIEF COMPLAINT:  Chest pain.   HISTORY OF PRESENT ILLNESS:  This patient is a 50 year old white male with  coronary heart disease who presented to the emergency room with substernal  chest tightness.  He denied radiation.  He had received two nitroglycerin  prior to presentation which gave him some relief.  He had some intermittent  pain following that and received two additional nitroglycerin which helped  relieve his pain.  He did have some sweatiness, he had vomited once.  He  felt as though he had become overheated.  He had brief shortness of breath.  He has previously had three stents placed last May by Dr. Riley Kill following  an MI he reports.  He states the pain yesterday was similar to the pre-stent  pain. He has a history of hypertension and hyperlipidemia but no diabetes.  He has a family history of heart disease in both parents.   PAST MEDICAL HISTORY:  1.  Coronary artery disease.  2.  Back surgery by Dr. Alveda Reasons.  3.  Appendectomy.   MEDICATIONS:  1.  Flexeril 10 mg b.i.d.  2.  Zantac 150 mg b.i.d. p.r.n.  3.  Lopressor unknown dose t.i.d.  4.  Plavix 75 mg daily.  5.  Lipitor 80 mg daily.  6.  Hydrocodone 10 mg 8-10 per day.   ALLERGIES:  None.   SOCIAL HISTORY:  He quit smoking when he had his stents placed.  He does not  drink.  He has not worked for a year because of his back.   FAMILY HISTORY:  Both parents had coronary artery disease.   REVIEW OF SYSTEMS:  Noncontributory.   PHYSICAL EXAM:  VITAL SIGNS:  Temperature 98.1, pulse 90, respirations 20,  blood pressure 149/94, oxygen saturation 99%.  GENERAL:  Alert and  oriented in no distress now.  HEENT: Eyes and oropharynx were unremarkable.  NECK:  No JVD or thyromegaly.  LUNGS:  Clear.  HEART:  Regular with no murmurs.  ABDOMEN:  Nontender.  No hepatosplenomegaly.  EXTREMITIES:  Normal pulses.  No cyanosis, clubbing or edema. Has a tattoo  on the left arm.  NEURO:  Grossly intact.  LYMPH NODES:  No enlargement.   LABORATORY DATA:  Sodium 143, potassium 3.6, BUN 15, creatinine 86, glucose  86. Creatinine 1.1, hemoglobin 14.6. Cardiac markers x3 are normal. EKG  reveals normal sinus rhythm/sinus tachycardia with RSR prime in V1.   IMPRESSION:  1.  Chest pain. He shows no sign of recurrent infarct at this point.  He      will be hospitalized in a monitored setting and serial cardiac enzymes      will be obtained.  Cardiology consultation will be  obtained.  Dr. Colon Branch      has discussed his case with the on-call physician with [facility]. Continue Lopressor, Lipitor, Plavix and aspirin.  2.  Chronic back pain. Continue Flexeril and hydrocodone as needed.      Kingsley Callander. Ouida Sills, MD  Electronically Signed     ROF/MEDQ  D:  11/25/2005  T:  11/25/2005  Job:  [id number]

## 2010-10-05 NOTE — Assessment & Plan Note (Signed)
[name]                              CARDIOLOGY OFFICE NOTE   [name], [name]                   MRN:          [id number]  DATE:12/05/2005                            DOB:          1961/05/14    [name] is in for a followup visit.  He was recently hospitalized  after a dispute.  He ended up in jail.  He then apparently developed some  chest pain, and was taken to the emergency room.  He says that it is not an  important event, and that it basically constituted a bunch of paperwork.  His hemorrhoidal bleeding has essentially stopped.  When he was in the  hospital, his hemoglobin came back at 14.6.  He now remains on aspirin and  Plavix, and is getting on a regular basis.  He has problems with his back.  Specifically, surgery has been recommended.  He has seen Dr. Alveda Reasons.  He says  that he only gets some discomfort when he is out in the heat, and overall he  has done really pretty well.   MEDICATIONS:  1.  Flexeril 10 mg b.i.d.  2.  Aspirin 325 mg daily.  3.  Lopressor 25 mg t.i.d.  4.  Plavix 75 mg daily.  5.  Lipitor 40 mg daily.  6.  Hydrocodone.   PHYSICAL EXAMINATION:  The blood pressure is 104/72, the pulse is 52.  LUNGS:  The lung fields are clear.  CARDIAC:  Rhythm is regular.  No murmurs appreciated.  ABDOMEN:  Soft.   The patient's electrocardiogram demonstrates sinus bradycardia with a rate  of 52.   Overall, [name] is stable.  His hemoglobin is stabilized.  He is on  aspirin and Plavix.  I would defer surgery for a minimum of 6 months, and  preferably a year if possible.  If this cannot be done, I would try to do  the surgery on aspirin, and we would hold Plavix at least for about 5 days  while the surgery is done.  He could start back on Plavix afterwards.  I  will have him follow up with me in approximately 2 months.                              Arturo Morton. Riley Kill, MD, [facility]    TDS/MedQ  DD:   12/05/2005  DT:  12/06/2005  Job #:  [id number]   cc:   Nelda Severe, MD

## 2010-10-05 NOTE — Discharge Summary (Signed)
[name], [name]            ACCOUNT NO.:  [id number]   MEDICAL RECORD NO.:  [id number]          PATIENT TYPE:  INP   LOCATION:  A216                          FACILITY:  [facility]   PHYSICIAN:  Osvaldo Shipper, MD     DATE OF BIRTH:  August 23, 1960   DATE OF ADMISSION:  11/24/2005  DATE OF DISCHARGE:  07/10/2007LH                                 DISCHARGE SUMMARY   Please review H&P dictated by Dr. Ouida Sills for details regarding the patient's  present illness.   DISCHARGE DIAGNOSES:  1.  Chest pain atypical for cardiac etiology.  2.  History of coronary artery disease requiring followup.  3.  History of chronic back pain.   BRIEF HOSPITAL COURSE:  Briefly, this is a 50 year old Caucasian male with  coronary artery disease who presented with substernal chest tightness.  Apparently the patient was under police custody when this happened.  He does  have history of coronary artery disease and has had stents placed by Dr.  Riley Kill in the past.  Because of his history, he was admitted in the  hospital and further workup was initiated.  Essentially the patient ruled  out for acute coronary syndrome by negative cardiac markers.  He was seen in  consultation by Dr. Dietrich Pates and Dr. Tenny Craw from [facility].  It was  felt that this pain was somewhat atypical for coronary artery disease.  Also  because he ruled out for acute coronary syndrome, it was felt that further  management can be done as an outpatient.  In view of the above, today the  patient was discharged home.  He actually became pain free since he was  moved into the floor from the emergency department.  His chest x-ray did not  show any acute abnormalities.  His vital signs were all stable.  He was  asymptomatic as mentioned earlier.  In view of all of the above, it was felt  that he was stable enough to be discharged home.  [facility] did  recommend outpatient stress test to be arranged by them.   DISCHARGE MEDICATIONS:   The patient was given actually prescriptions for  Plavix and Lipitor which he had run out of.  Otherwise, he was asked to  continue all of his medications as before.  These include Flexeril, Zantac,  Lopressor, Plavix, Lipitor and hydrocodone as needed.  Please see the H&P  for exact doses.   CONSULTATION:  [facility].   DIET:  Heart healthy.   PHYSICAL ACTIVITY:  No restrictions.      Osvaldo Shipper, MD  Electronically Signed     GK/MEDQ  D:  11/26/2005  T:  11/26/2005  Job:  [id number]   cc:   Arturo Morton. Riley Kill, M.D. [facility]  [address]  [address]  [place]  [place] [address]

## 2010-10-05 NOTE — H&P (Signed)
NAMEJACOBY, ZANNI            ACCOUNT NO.:  [id number]   MEDICAL RECORD NO.:  [id number]          PATIENT TYPE:  INP   LOCATION:  NA                           FACILITY:  [facility]   PHYSICIAN:  Nelda Severe, MD      DATE OF BIRTH:  1960/11/04   DATE OF ADMISSION:  05/31/2005  DATE OF DISCHARGE:                                HISTORY & PHYSICAL   CHIEF COMPLAINT:  Back, right buttock, and right leg pain.   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  He picked up  a piece of metal, turned to hand it to another employee, and had a popping  sensation in his back.  Subsequent to this, he developed the above mentioned  pain.  The pain is aggravated by all activity, particularly walking and  standing.  It has been primarily buttock and leg pain, although, there is a  significant aspect of actual back pain.  He has been temporarily totally  disabled for some time.   CURRENT MEDICATIONS:  1.  Neurontin 2400 mg per day.  2.  Norco 10 four to six tablets per day.   His musculoskeletal examination is noteworthy for a severe right antalgic  gait.  He uses a cane, actually held in the right hand.  It is really  impossible to ask him to flex or extend the spine because it is so painful.  He is tender at L4-5 and L5-S1 centrally.  Straight leg raising seated is to  approximately 45 degrees on the right and 70 degrees on the left.  All  muscle groups are grade IV on the right side secondary to pain.  All muscle  groups on the left lower extremity are grade V.  Knee jerks and ankle jerks  were symmetric.  There is altered sensation to light touch over the dorsum  and sole of the right foot.   An MRI scan shows a degenerative L4-5 disk with spinal stenosis at L4-5.  Plain radiographs are actually within normal limits.   ADMISSION DIAGNOSIS:  L4-5 spinal stenosis with degenerative L4-5 disk.   DISCUSSION:   We plan to do a bilateral laminectomy at L4-5 with lateral  recess decompression.  Actually, he has been seen for a second opinion by  Sharolyn Douglas, M.D. who agrees with the surgical approach, although, Dr. Noel Gerold  cautioned, and I agree, that this man may very well not get an optimal  result.  He seems very disabled and to be in great pain, considering the  magnitude of the pathology.  Nonetheless it is present and consistent with  his symptoms and he has now been continuously disabled for a period beyond 6  months.   The nature of the procedure has been described to him and he has been  informed by both me and by Dr. Noel Gerold as to the fact that there is a  significant risk of a suboptimal outcome.  General risks discussed have  included:  Death, heart attack, stroke, phlebitis in the leg or pelvic  veins, pulmonary embolism, pneumonia, infection, excessive blood loss with  the need for transfusion, and very small risk of HIV, hepatitis, or  transfusion reaction.  Specific risks discussed included, but are not  limited to the following:  Varying degrees of paralysis, loss of sensation,  increased and/or new pain; dural leak with the need for recumbency to the  third day postoperative with possible need for return to the operating room;  epidural hematoma with need to return to the operating room, new problems at  the same or other levels requiring more surgery up to and including fusion  surgery.   The physical examination including chest, abdomen, etc., has been performed  and recorded by Odis Hollingshead, PA, today.  This has been filled in on a  [facility] admission history form.      Nelda Severe, MD  Electronically Signed     MT/MEDQ  D:  05/28/2005  T:  05/28/2005  Job:  [id number]

## 2010-10-05 NOTE — Discharge Summary (Signed)
[name], [name]            ACCOUNT NO.:  [id number]   MEDICAL RECORD NO.:  [id number]          PATIENT TYPE:  INP   LOCATION:  5009                         FACILITY:  [facility]   PHYSICIAN:  Nelda Severe, MD      DATE OF BIRTH:  Jan 30, 1961   DATE OF ADMISSION:  05/31/2005  DATE OF DISCHARGE:  06/02/2005                                 DISCHARGE SUMMARY   SERVICE:  Orthopedics.   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   DISCHARGE DIAGNOSES:  1.  Lumbar stenosis, L4-5 herniated nucleus pulposis.  2.  Status post L4-5 laminectomy.   BRIEF HISTORY:  This is a 50 year old male with complaints of back pain and  right leg pain.  The patient states that he had picked up a piece of metal  at work and handed it to his coworker.  At that time, he felt a pop in his  back and started having lower back pain that radiated down his right leg.  He also had some numbness and tingling.  He states he did have some weakness  in the right leg.   PROCEDURE:  L4-5 laminectomy.   HOSPITAL COURSE:  This man was admitted on May 31, 2005.  He underwent  an L4-5 laminectomy.  There were no complications during surgery.  He was  recovered in the recovery room and then up to the orthopedics floor.  Upon  postoperative day #1, the patient still had some complaints of right leg  pain that radiated down the back of his leg.  On postoperative day #2, he  stated that his pain in his leg was getting a little bit better.  He did  state that he was still a little wobbly on his feet.  He did still need  moderate assistance getting up and out of the bed and actually walking to  and from the bathroom.  He was able to void at time of discharge.   DISCHARGE MEDICATIONS:  1.  Norco 10/325 #75 one p.o. q.4h. p.r.n. pain.  2.  Ambien 10 mg #30 one p.o. q.h.s. insomnia.  3.  Flexeril 10 mg #40 one p.o. q.12h. p.r.n.  4.  Lyrica 75 mg one p.o. b.i.d. x6 days, then 150 mg one p.o.  b.i.d.      thereafter.   DISCHARGE INSTRUCTIONS:  The patient was instructed to take medications as  they were prescribed.  The patient had questioned whether or not we could  get a sitter for him for approximately eight hours a day.  A prescription  was written for the patient to be allowed to have a sitter for eight hours a  day seeing how he would be at home by himself.  This will be allowed for  eight hours per day x5 days until the patient started being able to ambulate  more so on his own.  He was told that he could shower.  However, he needed  to keep his incision clean and dry.  If there are any signs of infection,  redness, pus, red streak or he started running a temperature, he needs  to  notify the office as soon as possible.  He is told to follow up with Dr.  Alveda Reasons in two weeks for recheck of his leg pain.   CONDITION UPON DISCHARGE:  Stable.   DISCHARGE DIET:  Regular.     ______________________________  Odis Hollingshead, PA      Nelda Severe, MD  Electronically Signed    BLG/MEDQ  D:  06/02/2005  T:  06/03/2005  Job:  [id number]

## 2010-10-05 NOTE — H&P (Signed)
[name], [name]            ACCOUNT NO.:  [id number]   MEDICAL RECORD NO.:  [id number]          PATIENT TYPE:  INP   LOCATION:  2910                         FACILITY:  [facility]   PHYSICIAN:  Arturo Morton. Riley Kill, M.D. [facility] OF BIRTH:  [birth year]   DATE OF ADMISSION:  10/02/2005  DATE OF DISCHARGE:                                HISTORY & PHYSICAL   CHIEF COMPLAINT:  Chest pain.   HISTORY OF PRESENT ILLNESS:  The patient is a 50 year old white male who  presents with a positive family history of smoking.  He has had stuttering  chest pain for several months.  It has become worse over the past 2 weeks  and he subsequently went to the [facility] Emergency Room, where he was  subsequently discharged.  He was supposed to see Dr. Gareth Morgan in  followup, but the patient cancelled his office visit.  He subsequently  presents to the emergency room at [facility] with small ongoing chest  pain since 5:00 p.m. tonight and there is some ST elevation in the anterior  precordial leads.  The fellow was contacted, and subsequently Lovenox,  aspirin, and oral clopidogrel administered and he was transferred emergently  for cardiac catheterization.   PAST MEDICAL HISTORY:  1.  Laminectomy.  2.  Appendectomy.  3.  No history of diabetes or hypertension.   ALLERGIES:  PEPPERMINT only.   MEDICATIONS:  Hydrocodone/APAP 10/500 q.6 h. p.r.n.  1.  Celebrex 200 mg daily.  2.  Flexeril 10 mg b.i.d.   FAMILY HISTORY:  The patient's mother is 34 and is currently hospitalized  with cancer of the bladder.  His father died of 54 of a myocardial  infarction.  He has 13 brothers and sisters   SOCIAL HISTORY:  The patient is engaged.  He has children ranging from 23-  26.  He is out on workman's compensation at the present time.  He smokes  about a pack of cigarettes daily.   REVIEW OF SYSTEMS:  Remarkable for constipation.  He has had chest pain for  about 6 months.  He has had no diarrhea.   He has had significant shortness  of breath with exertion with very limited activity.  The review of systems  is otherwise negative.   PHYSICAL EXAMINATION:  GENERAL:  On examination, he is an alert and oriented  white male in no acute distress.  VITAL SIGNS:  Blood pressure is 124/87, pulse 72, respirations 18,  temperature -- afebrile.  NECK:  There is no jugular venous elevation.  LUNGS:  Clear to auscultation and percussion.  There are decreased breath  sounds bilaterally compatible with early COPD.  CARDIAC:  PMI is nondisplaced.  There is a normal first and second heart  sound without murmurs, rubs or gallops.  ABDOMEN:  Soft.  No obvious  hepatosplenomegaly noted.  Pulses are intact.  No femoral bruits.  NEUROLOGIC:  Exam was unremarkable.   IMPRESSION:  1.  Acute anterior wall myocardial infarction.  2.  History of chronic back disease with surgery 6 months earlier and      continued symptoms.  3.  Chronic smoking history.   PLAN:  The patient will undergo urgent cardiac catheterization.  I have  discussed this with him and posed the risks and benefits to him.  He is  agreeable to proceed.      Arturo Morton. Riley Kill, M.D. [facility]  Electronically Signed     TDS/MEDQ  D:  10/02/2005  T:  10/02/2005  Job:  [id number]

## 2010-10-05 NOTE — Discharge Summary (Signed)
[name], [name]            ACCOUNT NO.:  [id number]   MEDICAL RECORD NO.:  [id number]          PATIENT TYPE:  INP   LOCATION:  2910                         FACILITY:  [facility]   PHYSICIAN:  Ronald Pick. Nishan, MD,FACCDATE OF BIRTH:  08/03/1960   DATE OF ADMISSION:  10/02/2005  DATE OF DISCHARGE:  10/06/2005                                 DISCHARGE SUMMARY   PRIMARY CARDIOLOGIST:  Dr. Shawnie Pons   PRINCIPAL DIAGNOSES:  1. Acute anterior myocardial infarction.      a.     Emergent bare metal stenting, a subtotal proximal left anterior       descending artery.      b.     Staged CYPHER stenting right coronary artery (x300).      c.     Mild LVG (EF 40-45%).  2. Tobacco.   PROCEDURES:  Emergent coronary angiography with bare metal stenting of  approximate LAD, by Dr. Bonnee Quin; staged percutaneous intervention with  CYPHER stenting, by Dr. Randa Evens, of proximal/mid right coronary  artery on May 18th.   HOSPITAL COURSE:  [name] is a 50 year old male, with no known  history of coronary artery disease, with cardiac risk factors notable for  tobacco smoking and family history, who initially presented to [facility] with chest pain.  He was found to have anterior ST  elevation and, following stabilization, was transferred for emergent  coronary angiography.   The patient underwent successful percutaneous intervention, by Dr. Bonnee Quin.   The patient was taken directly to the cardiac catheterization, where he  underwent a successful emergent percutaneous intervention, by Dr. Bonnee Quin (see notes for full details).  He was found to have severe residual  ACA disease, and returned 2 days later for staged intervention, performed by  Dr. Randa Evens, with placement of CYPHER stent to both the proximal and  mid right coronary artery.   Dr. Samule Ohm recommended to continue treatment with aspirin and Plavix  indefinitely.   Postoperatively,  the patient developed a small bilateral groin hematoma with  no evidence of bruit on auscultation.   The patient was cleared for discharge on hospital day #4.   DISCHARGE MEDICATIONS:  1. Plavix 75 daily.  2. Aspirin 325 daily.  3. Flexeril 10 mg b.i.d.  4. Metoprolol 6.25 mg b.i.d.  5. Atorvastatin 40 mg q.h.s.   FOLLOWUP:  The patient was advised to arrange followup with Dr. Bonnee Quin  in 2 weeks.   DISPOSITION:  Stable.   OUTSTANDING LABORATORY DATA:  Normal electrolytes and renal function.  Negative total CPK.  Lipid profile: Total cholesterol 140, triglycerides 62,  HDL 24 and LDL 104.   INSTRUCTIONS:  The patient advised to refrain from any strenuous  activities/heavy lifting, or driving for one week.     ______________________________  Rozell Searing, [name]    ______________________________  Ronald Pick. Eden Emms, MD,FACC    GS/MEDQ  D:  01/03/2006  T:  01/04/2006  Job:  [id number]   cc:   Lucky Rathke, MD

## 2010-10-05 NOTE — Assessment & Plan Note (Signed)
[facility]                            CARDIOLOGY OFFICE NOTE   MARCELLE, BEBOUT                   MRN:          [id number]  DATE:06/13/2006                            DOB:          January 27, 1961    Mr. Berns is in for a followup visit today.  He seems to under a  lot of stress; he apparently is being pressured in regard to his back  situation and whether or not he needs to go ahead and have surgery.  He  is not out 1 year from implantation of a drug-eluting stent, and I have  expressed to the patient that the optimal situation with regarded to a  drug-eluting stent, particularly in his situation where it was used in a  near total occlusion with overlapping drug-eluting stents would be to  continue aspirin and Plavix for a year.  Because of the need for surgery  I said that I thought Plavix in a significant situation could be  withheld for a week if the patient was able to be maintained on aspirin.  I would be fairly reluctant to stop both aspirin and Plavix for the  surgery, although it appears that the surgery is relatively necessary.  Cardiac-wise he has had rare episodes of chest discomfort, but he  unfortunately continues to smoke.   PHYSICAL EXAMINATION:  Blood pressure is 120/82, the pulse is 78.  The  lung fields are clear and the cardiac rhythm is regular.  The  extremities do not reveal significant edema.   The electrocardiogram demonstrates normal sinus rhythm and is within  normal limits.   IMPRESSION:  1. Significant coronary artery disease with ejection fraction of 50%      and 70% narrowing of a tiny diagonal, 40% circumflex narrowing and      widely patent right coronary artery at the sites of the previous      stents.  2. Hypercholesterolemia on lipid-lowing therapy.   DISPOSITION:  1. Continue current medical regiment.  2. If the patient needs to have surgery, this could be accomplished      with aspirin alone with  Plavix held, although the optimum situation      is to continue aspirin and Plavix for 1 year at minimum, and      preferably there after.  3. Return to clinic in 3 months.   ADDENDUM:  We discussed the contribution of his back problems to his  acute myocardial infarction.  He was having significant back discomfort  at the time of the acute myocardial infarction, and this could have been  contributory in the initial etiology.  He does, however, have  significant underlying coronary artery disease and multiple cardiac risk  factors.  Of note, the patient has also missed multiple office visits,  and our nursing staff worked aggressively to try to get him free  medications which he comes for to pick up.  Finally, we likely need to  repeat his lipid and liver profile in the near future.  There was an  extensive discussion with the patient.    Arturo Morton. Riley Kill, MD, [facility]  Electronically Signed   TDS/MedQ  DD: 06/13/2006  DT: 06/13/2006  Job #: [id number]

## 2010-10-05 NOTE — Consult Note (Signed)
[name], [name]            ACCOUNT NO.:  [id number]   MEDICAL RECORD NO.:  [id number]          PATIENT TYPE:  INP   LOCATION:  A216                          FACILITY:  [facility]   PHYSICIAN:  Pricilla Riffle, M.D.    DATE OF BIRTH:  12-Sep-1960   DATE OF CONSULTATION:  11/25/2005  DATE OF DISCHARGE:                                   CONSULTATION   INDICATIONS:  The patient is a 50 year old gentleman who are asked to see  regarding chest pain.   HISTORY OF PRESENT ILLNESS:  The patient has a history of coronary artery  disease status post anterior wall MI back in May 2007, cardiac  catheterization at the time, underwent bare metal stent to the LAD on May  16; on May 19 had overlapping Cypher stents to the RCA. Developed chest pain  again off of Plavix and in early June underwent cardiac catheterization  which showed patent stents with a 70% ostial mid circumflex, planned for  medical therapy. That was on June 5. He was last seen in cardiology clinic  on June 19, seemed to be doing well from a cardiac standpoint; had some  bright red blood per rectum. Plan was to be followed up by Dr. Christella Hartigan,  continue on Plavix, could hold aspirin prior to any evaluation.   The patient said he had been feeling well until the day of admission,  yesterday. He was under increased stress. He was in a dispute with his  girlfriend and served a warrant. In jail, things were stressful, also hot.  Developed chest pain around 3 p.m., felt nauseated, pain on and off until 8  p.m. went to the emergency room again. Had 2 nitroglycerin with slight  easing prior to ER. Complained of some headache here, took nitroglycerin and  Lopressor. Note, he had not been on Lopressor at home for 2 days. Chest pain  gone. No shortness of breath now. No chest pain.   ALLERGIES:  PEPPER AND APPLES.   MEDICATIONS:  Prior to admission:  1.  Flexeril 10 mg b.i.d.  2.  Zantac p.r.n.  3.  Lopressor t.i.d. Again, patient had been  off for 2 days.  4.  Plavix 75 mg daily.  5.  Aspirin 81 mg daily.  6.  Lipitor as directed.   PAST MEDICAL HISTORY:  1.  Coronary artery disease.  2.  History of noncompliance secondary to financial issues.  3.  Dyslipidemia.  4.  Bright red blood per rectum being followed by Dr. Christella Hartigan.   SOCIAL HISTORY:  The patient quit tobacco on May 2007, drinks occasionally,  was engaged.   FAMILY HISTORY:  Mother is alive at age 40 with bladder cancer. Father died  of a MI at age 55. Thirteen brothers and sisters.   REVIEW OF SYSTEMS:  All systems reviewed. Negative except as noted above.   PHYSICAL EXAMINATION:  GENERAL:  The patient currently in no distress. Blood  pressure 101 to 144/52, pulse 60 to 70s, temperature 98.0. Patient in no  acute distress, pain free.  HEENT:  Normocephalic, atraumatic. PERRL.  NECK:  JVP is normal.  Positive bruit on the right.  LUNGS:  Clear to auscultation.  CARDIAC:  Regular rate and rhythm. S1 and S2. No S3, S4 or murmurs noted.  ABDOMEN:  Supple. No hepatosplenomegaly.  EXTREMITIES:  2+ pulses distally. No lower extremity edema.  NEUROLOGICAL:  Alert and oriented x3. Cranial nerves II-XII grossly intact.  Motor exam 5/5 throughout.   STUDIES:  Twelve-lead EKG shows sinus tachycardia, rate of 108 beats per  minute.   Labs significant for hemoglobin of 14.6, BUN and creatinine of 15 and 1.1,  potassium 3.6. Initial troponin point of care negative, less than 0.05 x2.  Troponin in the hospital 0.03. CK-MB of 87 and 1.5.   IMPRESSION:  The patient is a 50 year old gentleman with known history of  coronary artery disease status post recent stenting to the LAD and RCA.  Yesterday, repeat cardiac catheterization early June showed patent stents,  some native vessel disease (moderate). Yesterday was very stressful for him.  He was served a warrant, was incarcerated, hot, had not taken his Lopressor  for a couple of days, developed chest pain. Chest pain is  now resolved.  Enzymes are negative. Overall, it does not go along with any stent problem.  May represent lack of taking of medicine.   Would continue medical therapy. Finish cycle of enzymes. If negative, can  discontinue home on current regimen. If positive, will need further  evaluation.   Will need fasting lipids checked. Will also need to have set up for carotid  Dopplers done in our [facility] office.           ______________________________  Pricilla Riffle, M.D.     PVR/MEDQ  D:  11/25/2005  T:  11/25/2005  Job:  [id number]   cc:   Arturo Morton. Riley Kill, M.D. [facility]  [address]  [address]  [place]  [place] [address]

## 2010-10-05 NOTE — Op Note (Signed)
[name], [name]            ACCOUNT NO.:  [id number]   MEDICAL RECORD NO.:  [id number]          PATIENT TYPE:  AMB   LOCATION:  DSC                          FACILITY:  [facility]   PHYSICIAN:  Sharolyn Douglas, M.D.        DATE OF BIRTH:  1961-02-18   DATE OF PROCEDURE:  07/21/2006  DATE OF DISCHARGE:                               OPERATIVE REPORT   DATE OF PROCEDURE:  July 21, 2006 at [facility].   DIAGNOSES:  1. Lumbar spondylosis.  2. Chronic back pain.   PROCEDURE:  1. L4-5 and L5-S1 bilateral facet joint injections.  2. Fluoroscopic imaging used for needle placement of the above      injections.   SURGEON:  Sharolyn Douglas, M.D.   ASSISTANT:  None.   ANESTHESIA:  MAC plus local.   COMPLICATIONS:  None.   INDICATIONS:  Patient is a 50 year old male with chronic back pain  thought to be secondary to lumbar spondylosis and post laminectomy  syndrome.  He is not a surgical candidate currently.  He has failed all  options of conservative treatment and now elects for potentially  diagnostic and therapeutic facet injections and L4-5 and L5-S1.  Risks,  benefits, alternatives reviewed.   PROCEDURE:  After informed consent, he was taken to the operating room.  He was turned prone.  He underwent sedation by anesthesia.  The back was  prepped and draped in the usual sterile fashion.  Fluoroscopy was  brought into the field and images were obtained of the facet joints at  L4-5 and L5-S1.  A 22-gauge, Quincke spinal needles were advanced into  the facet joints bilaterally using the fluoroscopy.  Aspiration showed  no blood.  We then injected a solution of 40 mg Depo-Medrol and 1 mL  preservative-free 1% lidocaine into each facet joint at L4-5 and L5-S1  bilaterally.  The patient tolerated the procedure well.  No apparent  complications.  He was transferred to recovery in stable condition,  neurologically intact.  I have reviewed post injection instructions  with  him and he will follow up in 2 weeks.      Sharolyn Douglas, M.D.  Electronically Signed    MC/MEDQ  D:  07/21/2006  T:  07/21/2006  Job:  [id number]

## 2010-10-05 NOTE — Assessment & Plan Note (Signed)
[name]                              CARDIOLOGY OFFICE NOTE   [name], [name]                   MRN:          [id number]  DATE:02/06/2006                            DOB:          [birth year]    [name] is in for a followup visit. He is stable except unfortunately  he is smoking about a pack of cigarettes a day. He has been taking his  medicines.   He denies shortness of breath or progressive chest pain. He is scheduled to  see the neurosurgeon again tomorrow.   PHYSICAL EXAMINATION:  VITAL SIGNS:  Blood pressure is 115/60, pulse 71.  LUNGS:  Lung fields are clear.  CARDIAC:  Cardiac rhythm is regular. There is no murmur or rub.   The EKG revealed sinus rhythm with controlled ventricular normal sinus  rhythm with a right-ward oriented axis. The patient is stable and doing  well. I plan to see him back in followup in about 3 months. Lipid and liver  profile is warranted. I have spent nearly 20 minutes talking about  cigarettes today.                              Arturo Morton. Riley Kill, MD, [facility] [place]    TDS/MedQ  DD:  02/06/2006  DT:  02/08/2006  Job #:  [id number]

## 2010-11-07 ENCOUNTER — Ambulatory Visit: Payer: Self-pay | Admitting: Cardiology

## 2010-12-10 ENCOUNTER — Encounter: Payer: Self-pay | Admitting: Cardiology

## 2010-12-14 ENCOUNTER — Encounter: Payer: Self-pay | Admitting: Cardiology

## 2010-12-14 ENCOUNTER — Ambulatory Visit (INDEPENDENT_AMBULATORY_CARE_PROVIDER_SITE_OTHER): Payer: Medicare Other | Admitting: Cardiology

## 2010-12-14 DIAGNOSIS — F17200 Nicotine dependence, unspecified, uncomplicated: Secondary | ICD-10-CM

## 2010-12-14 DIAGNOSIS — I2510 Atherosclerotic heart disease of native coronary artery without angina pectoris: Secondary | ICD-10-CM

## 2010-12-14 DIAGNOSIS — E785 Hyperlipidemia, unspecified: Secondary | ICD-10-CM

## 2010-12-14 MED ORDER — ATORVASTATIN CALCIUM 20 MG PO TABS: 20.0000 mg | ORAL_TABLET | Freq: Every day | ORAL | Status: DC

## 2010-12-14 MED ORDER — METOPROLOL TARTRATE 25 MG PO TABS: 12.5000 mg | ORAL_TABLET | Freq: Two times a day (BID) | ORAL | Status: DC

## 2010-12-14 NOTE — Assessment & Plan Note (Signed)
Stopped all of his meds.  Will start atorvastatin at 20mg  per day, and then recheck in two months.

## 2010-12-14 NOTE — Progress Notes (Signed)
HPI:  Patient is seen today in follow up.  Overall he is doing well.  Denies chest pain.Marland Kitchen  He ran out of clopidogrel, atorvastatin, and Zetia.  He is not taking.  He had his surgery, and is doing well from the standpoint of his heart.  No chest pain.  He still smokes a few per day---girlfriend still smokes a lot--they have talked about trying to quit together.    Current Outpatient Prescriptions  Medication Sig Dispense Refill  . aspirin 81 MG tablet Take 81 mg by mouth daily.        . metoprolol tartrate (LOPRESSOR) 25 MG tablet Take 12.5 mg by mouth 2 (two) times daily.        . nitroGLYCERIN (NITROSTAT) 0.4 MG SL tablet Place 0.4 mg under the tongue every 5 (five) minutes as needed.        Marland Kitchen atorvastatin (LIPITOR) 40 MG tablet Take 40 mg by mouth daily.        . clopidogrel (PLAVIX) 75 MG tablet Take 75 mg by mouth daily.        Marland Kitchen ezetimibe (ZETIA) 10 MG tablet Take 10 mg by mouth daily.        Marland Kitchen oxymorphone (OPANA ER) 7.5 MG TB12 Take 7.5 mg by mouth every 12 (twelve) hours.          No Known Allergies  Past Medical History  Diagnosis Date  . CAD (coronary artery disease)   . HLD (hyperlipidemia)   . Back injury     crushing    No past surgical history on file.  Family History  Problem Relation Age of Onset  . Coronary artery disease      History   Social History  . Marital Status: Married    Spouse Name: N/A    Number of Children: N/A  . Years of Education: N/A   Occupational History  . Not on file.   Social History Main Topics  . Smoking status: Former Games developer  . Smokeless tobacco: Not on file  . Alcohol Use: No  . Drug Use: Not on file  . Sexually Active: Not on file   Other Topics Concern  . Not on file   Social History Narrative  . No narrative on file    ROS: Please see the HPI.  All other systems reviewed and negative.  PHYSICAL EXAM:  BP 120/80  Pulse 52  Resp 18  Ht 6' (1.829 m)  Wt 150 lb (68.04 kg)  BMI 20.34 kg/m2  General: Well  developed, well nourished, in no acute distress. Head:  Normocephalic and atraumatic. Neck: no JVD Lungs: Clear to auscultation and percussion. Heart: Normal S1 and S2.  No murmur, rubs or gallops.  Abdomen:  Normal bowel sounds; soft; non tender; no organomegaly Pulses: Pulses normal in all 4 extremities. Extremities: No clubbing or cyanosis. No edema. Neurologic: Alert and oriented x 3.  EKG:  SB.  Otherwise normal.    ASSESSMENT AND PLAN:

## 2010-12-14 NOTE — Assessment & Plan Note (Signed)
Stopped his plavix.  No recurrent anginal pain.  Still smokes.  Stopped his lipitor as well.  No current symptoms.  Continue medical therapy at present.

## 2010-12-14 NOTE — Assessment & Plan Note (Signed)
Reviewed again with patient in detail.

## 2010-12-14 NOTE — Patient Instructions (Signed)
Your physician recommends that you schedule a follow-up appointment in: 1 year with Dr. Riley Kill  Fasting labs 03/05/11.  Your physician has recommended you make the following change in your medication: START ATORVASTATIN 20 mg daily.

## 2011-02-11 LAB — BASIC METABOLIC PANEL
BUN: 11
CO2: 26
CO2: 28
Calcium: 8.7
Creatinine, Ser: 0.89
GFR calc Af Amer: >60
GFR calc non Af Amer: >60
Glucose, Bld: 110 — ABNORMAL HIGH
Glucose, Bld: 97
Potassium: 3.7
Sodium: 140

## 2011-02-11 LAB — CARDIAC PANEL(CRET KIN+CKTOT+MB+TROPI)
CK, MB: 1.6
Relative Index: INVALID
Troponin I: <0.01

## 2011-02-11 LAB — HEPATIC FUNCTION PANEL
ALT: 15
AST: 19
Bilirubin, Direct: <0.1
Total Bilirubin: 0.3

## 2011-02-11 LAB — CBC
HCT: 37.5 — ABNORMAL LOW
HCT: 38 — ABNORMAL LOW
Hemoglobin: 12.8 — ABNORMAL LOW
Hemoglobin: 13
MCHC: 34.3
MCV: 92.2
RDW: 13.2
WBC: 6.7

## 2011-02-11 LAB — AMYLASE: Amylase: 46

## 2011-03-05 ENCOUNTER — Other Ambulatory Visit: Payer: Medicare Other | Admitting: *Deleted

## 2011-09-04 ENCOUNTER — Ambulatory Visit (INDEPENDENT_AMBULATORY_CARE_PROVIDER_SITE_OTHER): Payer: Medicare Other | Admitting: Cardiology

## 2011-09-04 ENCOUNTER — Encounter: Payer: Self-pay | Admitting: Cardiology

## 2011-09-04 VITALS — BP 114/71 | HR 62 | Ht 72.0 in | Wt 163.8 lb

## 2011-09-04 DIAGNOSIS — R519 Headache, unspecified: Secondary | ICD-10-CM | POA: Insufficient documentation

## 2011-09-04 DIAGNOSIS — F17200 Nicotine dependence, unspecified, uncomplicated: Secondary | ICD-10-CM

## 2011-09-04 DIAGNOSIS — E7800 Pure hypercholesterolemia, unspecified: Secondary | ICD-10-CM

## 2011-09-04 DIAGNOSIS — I2510 Atherosclerotic heart disease of native coronary artery without angina pectoris: Secondary | ICD-10-CM

## 2011-09-04 DIAGNOSIS — R51 Headache: Secondary | ICD-10-CM

## 2011-09-04 DIAGNOSIS — E785 Hyperlipidemia, unspecified: Secondary | ICD-10-CM

## 2011-09-04 LAB — HEPATIC FUNCTION PANEL
AST: 25 U/L (ref 0–37)
Alkaline Phosphatase: 65 U/L (ref 39–117)
Bilirubin, Direct: 0 mg/dL (ref 0.0–0.3)
Total Bilirubin: 0.2 mg/dL — ABNORMAL LOW (ref 0.3–1.2)

## 2011-09-04 LAB — LIPID PANEL: Total CHOL/HDL Ratio: 7

## 2011-09-04 MED ORDER — NITROGLYCERIN 0.4 MG SL SUBL: 0.4000 mg | SUBLINGUAL_TABLET | SUBLINGUAL | Status: DC | PRN

## 2011-09-04 NOTE — Assessment & Plan Note (Signed)
Not sure of mechanism.  BP is controlled. Encouraged him to check with his primary.  Suggested he might benefit from PT if workup otherwise negative.

## 2011-09-04 NOTE — Assessment & Plan Note (Signed)
Due for lipid and liver.   

## 2011-09-04 NOTE — Assessment & Plan Note (Signed)
Long discussion re: mechanism of ACS.  He knows not to smoke.  BP controlled and on ASA and beta blockers.

## 2011-09-04 NOTE — Progress Notes (Signed)
   HPI:  No chest pain.  Still smoking some.  Denies progressive symptoms.  Biggest issue is headache.  Extends from back of neck down to shoulders and top of head.  He has seen [facility] and referred to pain clinic.  No MRI or CT he knows of.  Long discussion about this.    Current Outpatient Prescriptions  Medication Sig Dispense Refill  . aspirin 81 MG tablet Take 81 mg by mouth daily.        Marland Kitchen atorvastatin (LIPITOR) 20 MG tablet Take 1 tablet (20 mg total) by mouth daily.  30 tablet  14  . metoprolol tartrate (LOPRESSOR) 25 MG tablet Take 0.5 tablets (12.5 mg total) by mouth 2 (two) times daily.  60 tablet  14  . nitroGLYCERIN (NITROSTAT) 0.4 MG SL tablet Place 0.4 mg under the tongue every 5 (five) minutes as needed.          No Known Allergies  Past Medical History  Diagnosis Date  . CAD (coronary artery disease)   . HLD (hyperlipidemia)   . Back injury     crushing    No past surgical history on file.  Family History  Problem Relation Age of Onset  . Coronary artery disease      History   Social History  . Marital Status: Married    Spouse Name: N/A    Number of Children: N/A  . Years of Education: N/A   Occupational History  . Not on file.   Social History Main Topics  . Smoking status: Former Games developer  . Smokeless tobacco: Not on file  . Alcohol Use: No  . Drug Use: Not on file  . Sexually Active: Not on file   Other Topics Concern  . Not on file   Social History Narrative  . No narrative on file    ROS: Please see the HPI.  All other systems reviewed and negative.  PHYSICAL EXAM:  BP 114/71  Pulse 62  Ht 6' (1.829 m)  Wt 163 lb 12.8 oz (74.299 kg)  BMI 22.22 kg/m2  General: Well developed, well nourished, in no acute distress. Head:  Normocephalic and atraumatic. Neck: no JVD Lungs: Clear to auscultation and percussion. Heart: Normal S1 and S2.  No murmur, rubs or gallops.  Pulses: Pulses normal in all 4 extremities. Extremities: No  clubbing or cyanosis. No edema. Neurologic: Alert and oriented x 3.  EKG:  SB.  Rightward axis.   ASSESSMENT AND PLAN:

## 2011-09-04 NOTE — Patient Instructions (Signed)
Your physician recommends that you have lab work today: LIPID and LIVER  Your physician wants you to follow-up in: 6 MONTHS.  You will receive a reminder letter in the mail two months in advance. If you don't receive a letter, please call our office to schedule the follow-up appointment.  Your physician recommends that you continue on your current medications as directed. Please refer to the Current Medication list given to you today.

## 2011-09-04 NOTE — Assessment & Plan Note (Signed)
Reviewed in detail.

## 2012-01-14 ENCOUNTER — Other Ambulatory Visit: Payer: Self-pay | Admitting: Cardiology

## 2012-07-02 ENCOUNTER — Ambulatory Visit: Payer: Medicare Other | Admitting: Cardiology

## 2012-07-22 ENCOUNTER — Ambulatory Visit: Payer: Medicare Other | Admitting: Cardiology

## 2012-08-20 ENCOUNTER — Encounter: Payer: Self-pay | Admitting: Cardiology

## 2012-12-18 ENCOUNTER — Other Ambulatory Visit: Payer: Self-pay | Admitting: Cardiology

## 2013-01-26 ENCOUNTER — Other Ambulatory Visit: Payer: Self-pay | Admitting: Cardiology

## 2013-01-26 DIAGNOSIS — R079 Chest pain, unspecified: Secondary | ICD-10-CM

## 2013-03-16 ENCOUNTER — Other Ambulatory Visit: Payer: Self-pay | Admitting: Cardiovascular Disease

## 2013-04-20 ENCOUNTER — Other Ambulatory Visit: Payer: Self-pay | Admitting: Cardiovascular Disease

## 2014-01-19 ENCOUNTER — Other Ambulatory Visit: Payer: Self-pay | Admitting: Internal Medicine

## 2014-01-19 NOTE — Telephone Encounter (Signed)
Ok to deny this? Patient has not been seen in a long time and has had multiple warnings to make appointment. Please advise. Thanks, MI

## 2014-01-19 NOTE — Telephone Encounter (Signed)
I cannot answer this, as he is not a Graciela Husbands patient. Dr. Graciela Husbands has never seen this patient. Please let me know if I can assist anymore.

## 2014-01-28 ENCOUNTER — Other Ambulatory Visit: Payer: Self-pay

## 2014-01-28 MED ORDER — METOPROLOL TARTRATE 25 MG PO TABS
ORAL_TABLET | ORAL | Status: DC
Start: 2014-01-28 — End: 2014-03-24

## 2014-01-31 ENCOUNTER — Encounter: Payer: Self-pay | Admitting: Cardiology

## 2014-01-31 ENCOUNTER — Telehealth: Payer: Self-pay | Admitting: Cardiology

## 2014-01-31 ENCOUNTER — Encounter: Payer: Self-pay | Admitting: *Deleted

## 2014-01-31 ENCOUNTER — Ambulatory Visit (INDEPENDENT_AMBULATORY_CARE_PROVIDER_SITE_OTHER): Payer: Medicare Other | Admitting: Cardiology

## 2014-01-31 VITALS — BP 106/71 | HR 74 | Ht 72.0 in | Wt 159.8 lb

## 2014-01-31 DIAGNOSIS — R079 Chest pain, unspecified: Secondary | ICD-10-CM

## 2014-01-31 DIAGNOSIS — I2510 Atherosclerotic heart disease of native coronary artery without angina pectoris: Secondary | ICD-10-CM

## 2014-01-31 DIAGNOSIS — E785 Hyperlipidemia, unspecified: Secondary | ICD-10-CM

## 2014-01-31 MED ORDER — AMLODIPINE BESYLATE 2.5 MG PO TABS: 2.5000 mg | ORAL_TABLET | Freq: Every day | ORAL | Status: DC

## 2014-01-31 MED ORDER — ATORVASTATIN CALCIUM 80 MG PO TABS: 80.0000 mg | ORAL_TABLET | Freq: Every day | ORAL | Status: AC

## 2014-01-31 NOTE — Patient Instructions (Addendum)
   Start Lipitor  daily. Sent to pharmacy.  Start Norvasc 2.5mg  daily. Sent to pharmacy. Continue all other medications.   Your physician has requested that you have a lexiscan myoview. For further information please visit [email or web address] Please follow instruction sheet, as given. Office will contact with results via phone or letter.   Your physician wants you to follow up in:  1 month.

## 2014-01-31 NOTE — Progress Notes (Addendum)
Clinical Summary [name] is a 53 y.o.male last seen by Dr Riley Kill over 3 years ago, this is our first visit together. He is seen for the following medical problems.   1. CAD - history of prior stenting to RCA and LAD - last cath 2009, 3 vessel non-obstructive disease with patent LAD and RCA stents, stable ostial D2 lesion, LVEF 50%  - has had some chest pain recently. Can have pressure/stabbing pain left chest, 6/10. +SOB. Left arm can tingle. Can sometimes be better with position. Can occur at rest or with exertion. Lasts for approx 10 to 15 minutes. Started approx 5-6 months, no change in frequency. Stable severity. Occurs approx once a week. Has tried NG with some improvement, but limited due to headaches.  - notes some increased DOE with activities. No LE edema, no orthopnea.  - compliant with meds  2. Hyperlipidemia - previously on lipitor, though stopped sometime in the past. He is not sure why    Past Medical History  Diagnosis Date  . CAD (coronary artery disease)   . HLD (hyperlipidemia)   . Back injury     crushing     No Known Allergies   Current Outpatient Prescriptions  Medication Sig Dispense Refill  . aspirin 81 MG tablet Take 81 mg by mouth daily.        Marland Kitchen atorvastatin (LIPITOR) 20 MG tablet Take 1 tablet (20 mg total) by mouth daily.  30 tablet  14  . metoprolol tartrate (LOPRESSOR) 25 MG tablet TAKE ONE-HALF (1/2) TABLET TWICE DAILY.  30 tablet  0  . NITROSTAT 0.4 MG SL tablet 1 TABLET UNDER TONGUE EVERY 5 MINUTES AS NEEDED  25 tablet  0   No current facility-administered medications for this visit.     No past surgical history on file.   No Known Allergies    Family History  Problem Relation Age of Onset  . Coronary artery disease       Social History [name] reports that he has quit smoking. He does not have any smokeless tobacco history on file. [name] reports that he does not drink alcohol.   Review of  Systems CONSTITUTIONAL: No weight loss, fever, chills, weakness or fatigue.  HEENT: Eyes: No visual loss, blurred vision, double vision or yellow sclerae.No hearing loss, sneezing, congestion, runny nose or sore throat.  SKIN: No rash or itching.  CARDIOVASCULAR: per HPI RESPIRATORY: No shortness of breath, cough or sputum.  GASTROINTESTINAL: No anorexia, nausea, vomiting or diarrhea. No abdominal pain or blood.  GENITOURINARY: No burning on urination, no polyuria NEUROLOGICAL: + headaches MUSCULOSKELETAL:+ back pain LYMPHATICS: No enlarged nodes. No history of splenectomy.  PSYCHIATRIC: No history of depression or anxiety.  ENDOCRINOLOGIC: No reports of sweating, cold or heat intolerance. No polyuria or polydipsia.  Marland Kitchen   Physical Examination p 74 bp 106/71 Wt 159 lbs BMI 22 Gen: resting comfortably, no acute distress HEENT: no scleral icterus, pupils equal round and reactive, no palptable cervical adenopathy,  CV: RRR, no m/r/g, no JVD, no carotid bruits Resp: Clear to auscultation bilaterally GI: abdomen is soft, non-tender, non-distended, normal bowel sounds, no hepatosplenomegaly MSK: extremities are warm, no edema.  Skin: warm, no rash Neuro:  no focal deficits Psych: appropriate affect   Diagnostic Studies 07/2007 Cath Central aortic pressure 116/71 with a mean of 90, LV pressure 111/5, an  EDP of 10. There is no aortic stenosis.  Left main was normal.  LAD was a long vessel  coursing to the apex, gave off 3 diagonals. There  is a 30% ostial LAD lesion. In the proximal to mid-section, there was a  long LAD stent. This was widely patent. There was some mild narrowing  at the proximal portion of stent, about 20% to 30%. Into the distal  LAD, there was mild diffuse disease with no flow-limiting lesions. In  the ostium of the second diagonal, there was an 80% focal lesion where  the diagonal came out from the stented section, this was stable. There  is good flow. Stable from  previous, there was good flow.  Left circumflex gave off tiny OM-1, a tiny OM-2, and a large OM-3.  There is a 30% ostial lesion of the circumflex and a 40% mid- lesion in  the AV groove circ.  Right coronary artery was a large dominant vessel, gave off an RV branch  acute marginal, a PDA and a posterolateral. There was a long stented  area from the proximal through the midsection. There was a 40% or 50%  focal lesion what appeared to be overlap site of the two stents,  although this was not clear. There was a 30% lesion more distally in  the stent. There was some minor luminal irregularities in the distal  RCA. There is no flow-limiting disease.  A left ventriculogram done the RAO position showed an ejection fraction  of approximately 50%. There was some mild anterior hypokinesis. There  is no significant mitral regurgitation.  ASSESSMENT:  1. Three-vessel nonobstructive coronary artery disease with patent LAD  and RCA stents, as described above.  2. Stable 80% lesion in the ostial of the second diagonal.  3. Left ventricular ejection fraction of 50%.  PLAN/DISCUSSION: There is no evidence of significant flow-limiting  disease to explain his resting angina. His enzymes have been negative.  I suspect his chest pain is not cardiac in nature. We will watch him  overnight, and he should be suitable for discharge home in the morning.  If his pain recurs, consider GI evaluation, either as an inpatient or  outpatient.    01/31/14 Clinic EKG NSR  Assessment and Plan  1. CAD - having episodes of chest pain mixed in characteristics for angina - will obtain Lexiscan MPI to risk stratify, he cannot run on treadmill due to history of severe lower back pain - start noravasc 2.5mg  daily as additional antianginal  2. Hyperlipidemia - restart lipitor  daily    F/u 1 month    Antoine Poche, M.D., F.A.C.C.

## 2014-01-31 NOTE — Telephone Encounter (Signed)
[name]- Dx: Chest pain Scheduled at [facility] Sept 24 register at 8am

## 2014-02-10 ENCOUNTER — Ambulatory Visit (HOSPITAL_COMMUNITY)
Admission: RE | Admit: 2014-02-10 | Discharge: 2014-02-10 | Disposition: A | Discharge status: Home / Self Care | Payer: Medicare Other | Source: Ambulatory Visit | Attending: Family Medicine | Admitting: Family Medicine

## 2014-02-10 ENCOUNTER — Encounter (HOSPITAL_COMMUNITY)
Admission: RE | Admit: 2014-02-10 | Discharge: 2014-02-10 | Disposition: A | Discharge status: Home / Self Care | Payer: Medicare Other | Source: Ambulatory Visit | Attending: Cardiology | Admitting: Cardiology

## 2014-02-10 ENCOUNTER — Encounter (HOSPITAL_COMMUNITY): Payer: Self-pay

## 2014-02-10 DIAGNOSIS — R079 Chest pain, unspecified: Secondary | ICD-10-CM | POA: Insufficient documentation

## 2014-02-10 DIAGNOSIS — R0602 Shortness of breath: Secondary | ICD-10-CM | POA: Diagnosis not present

## 2014-02-10 DIAGNOSIS — I5189 Other ill-defined heart diseases: Secondary | ICD-10-CM | POA: Insufficient documentation

## 2014-02-10 DIAGNOSIS — I519 Heart disease, unspecified: Secondary | ICD-10-CM | POA: Insufficient documentation

## 2014-02-10 MED ORDER — TECHNETIUM TC 99M SESTAMIBI GENERIC - CARDIOLITE
10.0000 | Freq: Once | INTRAVENOUS | Status: AC | PRN
  Administered 2014-02-10: 10 via INTRAVENOUS

## 2014-02-10 MED ORDER — TECHNETIUM TC 99M SESTAMIBI - CARDIOLITE
30.0000 | Freq: Once | INTRAVENOUS | Status: AC | PRN
  Administered 2014-02-10: 30 via INTRAVENOUS

## 2014-02-10 MED ORDER — REGADENOSON 0.4 MG/5ML IV SOLN
INTRAVENOUS | Status: AC
  Administered 2014-02-10: 0.4 mg via INTRAVENOUS
  Filled 2014-02-10: qty 5

## 2014-02-10 MED ORDER — SODIUM CHLORIDE 0.9 % IJ SOLN
10.0000 mL | INTRAMUSCULAR | Status: DC | PRN
  Administered 2014-02-10: 10 mL via INTRAVENOUS

## 2014-02-10 MED ORDER — SODIUM CHLORIDE 0.9 % IJ SOLN
INTRAMUSCULAR | Status: AC
  Administered 2014-02-10: 10 mL via INTRAVENOUS
  Filled 2014-02-10: qty 10

## 2014-02-10 MED ORDER — REGADENOSON 0.4 MG/5ML IV SOLN
0.4000 mg | Freq: Once | INTRAVENOUS | Status: AC
  Administered 2014-02-10: 0.4 mg via INTRAVENOUS

## 2014-02-10 NOTE — Procedures (Signed)
Stress Lab Nurses Notes - [name]  [name] 02/10/2014 Reason for doing test: Chest Pain, SOB Type of test: Steffanie Dunn Nurse performing test: Leitha Bleak RN Nuclear Medicine Tech: Jacklynn Bue Tech: None MD performing test: Joni Reining Family MD:  T. Anderson Test explained and consent signed: Yes.   IV started: Saline lock started in radiology Symptoms: pressure in face, chest and abdomen Treatment/Intervention: None Reason test stopped: protocol completed After recovery IV was: Discontinued via X-ray tech Patient to return to Nuc. Med at : 10:00 Patient discharged: Home Patient's Condition upon discharge was: stable Comments:  Pretest BP 92/63 HR56  Peak BP 134/90 HR 105   Pressure resolved, pt complaining of mild headache, pt eating and drinking [name], [name]

## 2014-02-14 ENCOUNTER — Telehealth: Payer: Self-pay | Admitting: *Deleted

## 2014-02-14 DIAGNOSIS — R079 Chest pain, unspecified: Secondary | ICD-10-CM

## 2014-02-14 NOTE — Telephone Encounter (Signed)
Message copied by Jerrye Beavers on Mon Feb 14, 2014 11:38 AM ------      Message from: [place] F      Created: Mon Feb 14, 2014 11:04 AM       Stress test does not show any evidence of blockages, however suggests that the pumping function of his heart may be decreased. He needs an echo to clarify this. Please order echo for chest pain.            Dominga Ferry MD ------

## 2014-02-14 NOTE — Telephone Encounter (Signed)
Pt informed of results. Will have Vicky call back to schedule echo

## 2014-02-18 ENCOUNTER — Ambulatory Visit (HOSPITAL_COMMUNITY)
Admission: RE | Admit: 2014-02-18 | Discharge: 2014-02-18 | Disposition: A | Discharge status: Home / Self Care | Payer: Medicare Other | Source: Ambulatory Visit | Attending: Cardiology | Admitting: Cardiology

## 2014-02-18 ENCOUNTER — Telehealth: Payer: Self-pay | Admitting: *Deleted

## 2014-02-18 DIAGNOSIS — E785 Hyperlipidemia, unspecified: Secondary | ICD-10-CM | POA: Insufficient documentation

## 2014-02-18 DIAGNOSIS — Z720 Tobacco use: Secondary | ICD-10-CM | POA: Diagnosis not present

## 2014-02-18 DIAGNOSIS — R079 Chest pain, unspecified: Secondary | ICD-10-CM | POA: Diagnosis not present

## 2014-02-18 DIAGNOSIS — R072 Precordial pain: Secondary | ICD-10-CM

## 2014-02-18 NOTE — Telephone Encounter (Signed)
Pt informed of results.

## 2014-02-18 NOTE — Telephone Encounter (Signed)
Message copied by Jerrye BeaversJONES, Sheccid Lahmann on Fri Feb 18, 2014  3:28 PM ------      Message from: White HallBRANCH, JONATHAN F      Created: Fri Feb 18, 2014  2:53 PM       Echo is normal, shows normal heart pumping function. His prior stress test suggested that the pumping function might be decreased, however the echocardiogram is a much more accurate test. Overall with a normal stress test and normal echo his heart appears to be in good shape. Will discuss in more detail and answer any questions at next follow up            Dominga FerryJ Branch MD ------

## 2014-02-18 NOTE — Progress Notes (Signed)
  Echocardiogram 2D Echocardiogram has been performed.  Leta JunglingCooper, Linkon Siverson M 02/18/2014, 12:29 PM

## 2014-02-23 ENCOUNTER — Ambulatory Visit: Payer: Medicare Other | Admitting: Cardiology

## 2014-03-24 ENCOUNTER — Other Ambulatory Visit: Payer: Self-pay | Admitting: Cardiology

## 2014-04-05 ENCOUNTER — Ambulatory Visit (INDEPENDENT_AMBULATORY_CARE_PROVIDER_SITE_OTHER): Payer: Medicare Other | Admitting: *Deleted

## 2014-04-05 ENCOUNTER — Ambulatory Visit (INDEPENDENT_AMBULATORY_CARE_PROVIDER_SITE_OTHER): Payer: Medicare Other | Admitting: Cardiology

## 2014-04-05 ENCOUNTER — Encounter: Payer: Self-pay | Admitting: Cardiology

## 2014-04-05 ENCOUNTER — Ambulatory Visit: Payer: Medicare Other | Admitting: Cardiology

## 2014-04-05 VITALS — BP 98/61 | HR 63 | Ht 72.0 in | Wt 158.0 lb

## 2014-04-05 DIAGNOSIS — I2510 Atherosclerotic heart disease of native coronary artery without angina pectoris: Secondary | ICD-10-CM

## 2014-04-05 DIAGNOSIS — R079 Chest pain, unspecified: Secondary | ICD-10-CM

## 2014-04-05 DIAGNOSIS — Z23 Encounter for immunization: Secondary | ICD-10-CM

## 2014-04-05 MED ORDER — NICOTINE 14 MG/24HR TD PT24: 14.0000 mg | MEDICATED_PATCH | Freq: Every day | TRANSDERMAL | Status: DC

## 2014-04-05 MED ORDER — NICOTINE 7 MG/24HR TD PT24: 7.0000 mg | MEDICATED_PATCH | Freq: Every day | TRANSDERMAL | Status: DC

## 2014-04-05 MED ORDER — NICOTINE 21 MG/24HR TD PT24: 21.0000 mg | MEDICATED_PATCH | Freq: Every day | TRANSDERMAL | Status: DC

## 2014-04-05 NOTE — Patient Instructions (Signed)
   Nicotine patch 21mg  daily x 6 weeks, 14mg  daily x 2 weeks, then 7mg  daily x 2 weeks, then STOP Continue all other medications.   Your physician wants you to follow up in: 6 months.  You will receive a reminder letter in the mail one-two months in advance.  If you don't receive a letter, please call our office to schedule the follow up appointment
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
## 2014-09-01 ENCOUNTER — Encounter: Payer: Self-pay | Admitting: Gastroenterology

## 2014-09-01 ENCOUNTER — Other Ambulatory Visit (HOSPITAL_COMMUNITY): Payer: Self-pay | Admitting: Nurse Practitioner

## 2014-09-01 DIAGNOSIS — M5116 Intervertebral disc disorders with radiculopathy, lumbar region: Secondary | ICD-10-CM

## 2014-09-01 DIAGNOSIS — M5030 Other cervical disc degeneration, unspecified cervical region: Secondary | ICD-10-CM

## 2014-09-08 ENCOUNTER — Ambulatory Visit (HOSPITAL_COMMUNITY)
Admission: RE | Admit: 2014-09-08 | Discharge: 2014-09-08 | Disposition: A | Discharge status: Home / Self Care | Payer: Medicare Other | Source: Ambulatory Visit | Attending: Nurse Practitioner | Admitting: Nurse Practitioner

## 2014-09-08 DIAGNOSIS — M5030 Other cervical disc degeneration, unspecified cervical region: Secondary | ICD-10-CM

## 2014-09-08 DIAGNOSIS — M5116 Intervertebral disc disorders with radiculopathy, lumbar region: Secondary | ICD-10-CM

## 2014-09-23 ENCOUNTER — Telehealth: Payer: Self-pay | Admitting: Gastroenterology

## 2014-09-23 ENCOUNTER — Ambulatory Visit: Payer: Medicare Other | Admitting: Gastroenterology

## 2014-09-23 ENCOUNTER — Encounter: Payer: Self-pay | Admitting: Gastroenterology

## 2014-09-23 NOTE — Telephone Encounter (Signed)
Patient was a no-show 

## 2014-09-23 NOTE — Telephone Encounter (Signed)
Mailed letter °

## 2014-10-20 ENCOUNTER — Other Ambulatory Visit: Payer: Self-pay | Admitting: Cardiology

## 2014-10-26 ENCOUNTER — Other Ambulatory Visit: Payer: Self-pay | Admitting: Neurosurgery

## 2014-10-26 DIAGNOSIS — M5416 Radiculopathy, lumbar region: Secondary | ICD-10-CM

## 2014-11-16 ENCOUNTER — Ambulatory Visit
Admission: RE | Admit: 2014-11-16 | Discharge: 2014-11-16 | Disposition: A | Discharge status: Home / Self Care | Payer: Medicare Other | Source: Ambulatory Visit | Attending: Neurosurgery | Admitting: Neurosurgery

## 2014-11-16 DIAGNOSIS — M5416 Radiculopathy, lumbar region: Secondary | ICD-10-CM

## 2014-11-16 MED ORDER — ONDANSETRON HCL 4 MG/2ML IJ SOLN
4.0000 mg | Freq: Once | INTRAMUSCULAR | Status: AC
  Administered 2014-11-16: 4 mg via INTRAMUSCULAR

## 2014-11-16 MED ORDER — MEPERIDINE HCL 100 MG/ML IJ SOLN
75.0000 mg | Freq: Once | INTRAMUSCULAR | Status: AC
  Administered 2014-11-16: 75 mg via INTRAMUSCULAR

## 2014-11-16 MED ORDER — IOHEXOL 180 MG/ML  SOLN
15.0000 mL | Freq: Once | INTRAMUSCULAR | Status: AC | PRN
  Administered 2014-11-16: 15 mL via INTRATHECAL

## 2014-11-16 MED ORDER — DIAZEPAM 5 MG PO TABS
10.0000 mg | ORAL_TABLET | Freq: Once | ORAL | Status: AC
  Administered 2014-11-16: 10 mg via ORAL

## 2014-11-16 NOTE — Discharge Instructions (Addendum)
Myelogram Discharge Instructions  1. Go home and rest quietly for the next 24 hours.  It is important to lie flat for the next 24 hours.  Get up only to go to the restroom.  You may lie in the bed or on a couch on your back, your stomach, your left side or your right side.  You may have one pillow under your head.  You may have pillows between your knees while you are on your side or under your knees while you are on your back.  2. DO NOT drive today.  Recline the seat as far back as it will go, while still wearing your seat belt, on the way home.  3. You may get up to go to the bathroom as needed.  You may sit up for 10 minutes to eat.  You may resume your normal diet and medications unless otherwise indicated.  Drink lots of extra fluids today and tomorrow.  4. The incidence of headache, nausea, or vomiting is about 5% (one in 20 patients).  If you develop a headache, lie flat and drink plenty of fluids until the headache goes away.  Caffeinated beverages may be helpful.  If you develop severe nausea and vomiting or a headache that does not go away with flat bed rest, call [phone].  5. You may resume normal activities after your 24 hours of bed rest is over; however, do not exert yourself strongly or do any heavy lifting tomorrow. If when you get up you have a headache when standing, go back to bed and force fluids for another 24 hours.  6. Call your physician for a follow-up appointment.  The results of your myelogram will be sent directly to your physician by the following day.  7. If you have any questions or if complications develop after you arrive home, please call [phone].  Discharge instructions have been explained to the patient.  The patient, or the person responsible for the patient, fully understands these instructions.       May resume Bupropion / Buspar and Duloxetine on November 17, 2014, after 11:00 am.

## 2014-11-16 NOTE — Progress Notes (Signed)
Pt states he has been off Buspar and Duloxetine for the past 2 days.   Discharge instructions explained to pt.

## 2014-12-08 ENCOUNTER — Telehealth: Payer: Self-pay | Admitting: *Deleted

## 2014-12-08 ENCOUNTER — Other Ambulatory Visit: Payer: Self-pay | Admitting: Neurosurgery

## 2014-12-08 NOTE — Telephone Encounter (Signed)
Pt is scheduled to undergo L/3-4 Lumbar Fusion general anesthesia Dr. Gerlene Fee from [facility] are requesting clearance. Will forward to Dr. Wyline Mood

## 2014-12-08 NOTE — Telephone Encounter (Signed)
Routed to [facility]

## 2014-12-08 NOTE — Telephone Encounter (Signed)
I have added an addendum to my 03/2014 note, ok with proceeding with back surgery. Please forward my note to his surgeon  Dominga Ferry MD

## 2014-12-12 ENCOUNTER — Encounter (HOSPITAL_COMMUNITY): Payer: Self-pay

## 2014-12-12 ENCOUNTER — Encounter (HOSPITAL_COMMUNITY)
Admission: RE | Admit: 2014-12-12 | Discharge: 2014-12-12 | Disposition: A | Discharge status: Home / Self Care | Payer: Medicare Other | Source: Ambulatory Visit | Attending: Neurosurgery | Admitting: Neurosurgery

## 2014-12-12 HISTORY — DX: Unspecified osteoarthritis, unspecified site: M19.90

## 2014-12-12 HISTORY — DX: Personal history of peptic ulcer disease: Z87.11

## 2014-12-12 HISTORY — DX: Chronic obstructive pulmonary disease, unspecified: J44.9

## 2014-12-12 HISTORY — DX: Personal history of other diseases of the digestive system: Z87.19

## 2014-12-12 HISTORY — DX: Essential (primary) hypertension: I10

## 2014-12-12 HISTORY — DX: Emphysema, unspecified: J43.9

## 2014-12-12 HISTORY — DX: Pleurisy: R09.1

## 2014-12-12 HISTORY — DX: Acute myocardial infarction, unspecified: I21.9

## 2014-12-12 HISTORY — DX: Other specified postprocedural states: Z98.890

## 2014-12-12 HISTORY — DX: Presence of coronary angioplasty implant and graft: Z95.5

## 2014-12-12 LAB — CBC
HCT: 46.3 % (ref 39.0–52.0)
Hemoglobin: 15.9 g/dL (ref 13.0–17.0)
MCH: 31.6 pg (ref 26.0–34.0)
MCHC: 34.3 g/dL (ref 30.0–36.0)
MCV: 92 fL (ref 78.0–100.0)
Platelets: 210 10*3/uL (ref 150–400)
RBC: 5.03 MIL/uL (ref 4.22–5.81)
RDW: 13.3 % (ref 11.5–15.5)
WBC: 5.5 10*3/uL (ref 4.0–10.5)

## 2014-12-12 LAB — BASIC METABOLIC PANEL
Anion gap: 4 — ABNORMAL LOW (ref 5–15)
BUN: 9 mg/dL (ref 6–20)
CALCIUM: 9.8 mg/dL (ref 8.9–10.3)
CO2: 27 mmol/L (ref 22–32)
CREATININE: 1.05 mg/dL (ref 0.61–1.24)
Chloride: 108 mmol/L (ref 101–111)
Glucose, Bld: 98 mg/dL (ref 65–99)
Potassium: 5.1 mmol/L (ref 3.5–5.1)
Sodium: 139 mmol/L (ref 135–145)

## 2014-12-12 LAB — TYPE AND SCREEN
ABO/RH(D): A POS
Antibody Screen: NEGATIVE

## 2014-12-12 LAB — SURGICAL PCR SCREEN
MRSA, PCR: NEGATIVE
Staphylococcus aureus: NEGATIVE

## 2014-12-12 NOTE — Progress Notes (Signed)
Patient has cardiac clearance located in chart and also in Epic from Dr. Wyline Mood (Notes, 12/08/2014 Telephone Encounter).

## 2014-12-12 NOTE — Pre-Procedure Instructions (Signed)
    [name]  12/12/2014      [facility] - [place], [place] - [address] [place] [address] Phone: [phone] Fax: [phone]    Your procedure is scheduled on 12/13/2014.  Report to [facility] Admitting at 530 A.M.  Call this number if you have problems in the days leading up to your surgery:  [phone]  Call this number if you have problems the morning of surgery:  [phone]   STOP: ALL Vitamins, Supplements, Herbal Medications, Fish Oils, Aspirins, NSAIDs  (Nonsteroidal Anti-inflammatories such as Ibuprofen, Aleve, or Advil), and Goody's/BC  Powders 7 days prior to surgery, until after surgery as directed by your physician.    Remember:  Do not eat food or drink liquids after midnight tonight, Monday July 25th.  Take these medicines the morning of surgery with A SIP OF WATER Amlodipine (Norvasc), Metoprolol (Lopressor). If needed: Duloxetine (Cymbalta), Gabapentin (Neurontin), Oxycodone, Inhaler.   Do not wear jewelry.  Do not wear lotions, powders, or perfumes.  You may wear deodorant.  Do not shave 48 hours prior to surgery.  Men may shave face and neck.  Do not bring valuables to the hospital.  [facility] is not responsible for any belongings or valuables.  Contacts, dentures or bridgework may not be worn into surgery.  Leave your suitcase in the car.  After surgery it may be brought to your room.  For patients admitted to the hospital, discharge time will be determined by your treatment team.  Patients discharged the day of surgery will not be allowed to drive home.   Special instructions:  Please follow these instructions carefully:  1. Shower with CHG Soap the night before surgery and the morning of Surgery. 2. If you choose to wash your hair, wash your hair first as usual with your normal shampoo. 3. After you shampoo, rinse your hair and body thoroughly to remove the  Shampoo. 4. Use CHG as you would any other liquid soap. You can apply chg directly to the skin and wash gently with scrungie or a clean washcloth. 5. Apply the CHG Soap to your body ONLY FROM THE NECK DOWN. Do not use on open wounds or open sores. Avoid contact with your eyes, ears, mouth and genitals (private parts). Wash genitals (private parts) with your normal soap. 6. Wash thoroughly, paying special attention to the area where your surgery will be performed. 7. Thoroughly rinse your body with warm water from the neck down. 8. DO NOT shower/wash with your normal soap after using and rinsing off the CHG Soap. 9. Pat yourself dry with a clean towel.  10. Wear clean pajamas.  11. Place clean sheets on your bed the night of your first shower and do not sleep with pets.  Day of Surgery  Do not apply any lotions/deodorants the morning of surgery. Please wear clean clothes to the hospital/surgery center.    Please read over the following fact sheets that you were given. Pain Booklet, Coughing and Deep Breathing, Blood Transfusion Information, MRSA Information and Surgical Site Infection Prevention

## 2014-12-13 ENCOUNTER — Inpatient Hospital Stay (HOSPITAL_COMMUNITY): Payer: Medicare Other

## 2014-12-13 ENCOUNTER — Inpatient Hospital Stay (HOSPITAL_COMMUNITY)
Admission: RE | Admit: 2014-12-13 | Discharge: 2014-12-15 | DRG: 460 | Disposition: A | Discharge status: Home / Self Care | Payer: Medicare Other | Source: Ambulatory Visit | Attending: Neurosurgery | Admitting: Neurosurgery

## 2014-12-13 ENCOUNTER — Inpatient Hospital Stay (HOSPITAL_COMMUNITY): Payer: Medicare Other | Admitting: Certified Registered Nurse Anesthetist

## 2014-12-13 ENCOUNTER — Encounter (HOSPITAL_COMMUNITY)
Admission: RE | Disposition: A | Discharge status: Home / Self Care | Payer: Self-pay | Source: Ambulatory Visit | Attending: Neurosurgery

## 2014-12-13 ENCOUNTER — Encounter (HOSPITAL_COMMUNITY): Payer: Self-pay | Admitting: *Deleted

## 2014-12-13 DIAGNOSIS — J449 Chronic obstructive pulmonary disease, unspecified: Secondary | ICD-10-CM | POA: Diagnosis present

## 2014-12-13 DIAGNOSIS — Z7982 Long term (current) use of aspirin: Secondary | ICD-10-CM | POA: Diagnosis not present

## 2014-12-13 DIAGNOSIS — Z79891 Long term (current) use of opiate analgesic: Secondary | ICD-10-CM | POA: Diagnosis not present

## 2014-12-13 DIAGNOSIS — I252 Old myocardial infarction: Secondary | ICD-10-CM

## 2014-12-13 DIAGNOSIS — M48061 Spinal stenosis, lumbar region without neurogenic claudication: Secondary | ICD-10-CM | POA: Diagnosis present

## 2014-12-13 DIAGNOSIS — Z955 Presence of coronary angioplasty implant and graft: Secondary | ICD-10-CM

## 2014-12-13 DIAGNOSIS — E785 Hyperlipidemia, unspecified: Secondary | ICD-10-CM | POA: Diagnosis present

## 2014-12-13 DIAGNOSIS — Z79899 Other long term (current) drug therapy: Secondary | ICD-10-CM | POA: Diagnosis not present

## 2014-12-13 DIAGNOSIS — Z91018 Allergy to other foods: Secondary | ICD-10-CM

## 2014-12-13 DIAGNOSIS — M4806 Spinal stenosis, lumbar region: Secondary | ICD-10-CM | POA: Diagnosis not present

## 2014-12-13 DIAGNOSIS — Z419 Encounter for procedure for purposes other than remedying health state, unspecified: Secondary | ICD-10-CM

## 2014-12-13 DIAGNOSIS — I2510 Atherosclerotic heart disease of native coronary artery without angina pectoris: Secondary | ICD-10-CM | POA: Diagnosis present

## 2014-12-13 DIAGNOSIS — I10 Essential (primary) hypertension: Secondary | ICD-10-CM | POA: Diagnosis present

## 2014-12-13 DIAGNOSIS — F17210 Nicotine dependence, cigarettes, uncomplicated: Secondary | ICD-10-CM | POA: Diagnosis present

## 2014-12-13 DIAGNOSIS — M549 Dorsalgia, unspecified: Secondary | ICD-10-CM | POA: Diagnosis present

## 2014-12-13 HISTORY — PX: LUMBAR PERCUTANEOUS PEDICLE SCREW 1 LEVEL: SHX5560

## 2014-12-13 HISTORY — PX: ANTERIOR LATERAL LUMBAR FUSION WITH PERCUTANEOUS SCREW 1 LEVEL: SHX5553

## 2014-12-13 SURGERY — ANTERIOR LATERAL LUMBAR FUSION WITH PERCUTANEOUS SCREW 1 LEVEL
Anesthesia: General | Site: Spine Lumbar

## 2014-12-13 MED ORDER — DEXAMETHASONE SODIUM PHOSPHATE 4 MG/ML IJ SOLN
INTRAMUSCULAR | Status: DC | PRN
  Administered 2014-12-13: 10 mg via INTRAVENOUS

## 2014-12-13 MED ORDER — HYDROMORPHONE HCL 1 MG/ML IJ SOLN
INTRAMUSCULAR | Status: AC
  Filled 2014-12-13: qty 1

## 2014-12-13 MED ORDER — FENTANYL CITRATE (PF) 250 MCG/5ML IJ SOLN
INTRAMUSCULAR | Status: AC
  Filled 2014-12-13: qty 5

## 2014-12-13 MED ORDER — CEFAZOLIN SODIUM-DEXTROSE 2-3 GM-% IV SOLR
2.0000 g | Freq: Three times a day (TID) | INTRAVENOUS | Status: AC
  Administered 2014-12-13 (×2): 2 g via INTRAVENOUS
  Filled 2014-12-13 (×2): qty 50

## 2014-12-13 MED ORDER — PROPOFOL 10 MG/ML IV BOLUS
INTRAVENOUS | Status: AC
  Filled 2014-12-13: qty 20

## 2014-12-13 MED ORDER — LIDOCAINE HCL (CARDIAC) 20 MG/ML IV SOLN
INTRAVENOUS | Status: DC | PRN
  Administered 2014-12-13: 100 mg via INTRAVENOUS

## 2014-12-13 MED ORDER — THROMBIN 20000 UNITS EX SOLR
CUTANEOUS | Status: DC | PRN
  Administered 2014-12-13: 20 mL via TOPICAL

## 2014-12-13 MED ORDER — CYCLOBENZAPRINE HCL 10 MG PO TABS
10.0000 mg | ORAL_TABLET | Freq: Three times a day (TID) | ORAL | Status: DC | PRN
  Administered 2014-12-13 – 2014-12-15 (×6): 10 mg via ORAL
  Filled 2014-12-13 (×6): qty 1

## 2014-12-13 MED ORDER — HYDROMORPHONE HCL 1 MG/ML IJ SOLN
INTRAMUSCULAR | Status: AC
  Administered 2014-12-13: 0.5 mg via INTRAVENOUS
  Filled 2014-12-13: qty 1

## 2014-12-13 MED ORDER — LIDOCAINE HCL (CARDIAC) 20 MG/ML IV SOLN
INTRAVENOUS | Status: AC
  Filled 2014-12-13: qty 5

## 2014-12-13 MED ORDER — FENTANYL CITRATE (PF) 100 MCG/2ML IJ SOLN
INTRAMUSCULAR | Status: DC | PRN
  Administered 2014-12-13: 50 ug via INTRAVENOUS
  Administered 2014-12-13: 150 ug via INTRAVENOUS
  Administered 2014-12-13 (×3): 50 ug via INTRAVENOUS
  Administered 2014-12-13: 100 ug via INTRAVENOUS
  Administered 2014-12-13 (×3): 50 ug via INTRAVENOUS

## 2014-12-13 MED ORDER — SUCCINYLCHOLINE CHLORIDE 20 MG/ML IJ SOLN
INTRAMUSCULAR | Status: AC
  Filled 2014-12-13: qty 1

## 2014-12-13 MED ORDER — OXYCODONE-ACETAMINOPHEN 5-325 MG PO TABS
ORAL_TABLET | ORAL | Status: AC
  Filled 2014-12-13: qty 2

## 2014-12-13 MED ORDER — DEXAMETHASONE SODIUM PHOSPHATE 10 MG/ML IJ SOLN: 10.0000 mg | INTRAMUSCULAR | Status: DC

## 2014-12-13 MED ORDER — BISACODYL 5 MG PO TBEC
5.0000 mg | DELAYED_RELEASE_TABLET | Freq: Every day | ORAL | Status: DC | PRN
  Filled 2014-12-13: qty 1

## 2014-12-13 MED ORDER — HYDROMORPHONE HCL 1 MG/ML IJ SOLN
1.0000 mg | INTRAMUSCULAR | Status: DC | PRN
  Administered 2014-12-13 (×4): 1 mg via INTRAMUSCULAR
  Administered 2014-12-14: 1.5 mg via INTRAMUSCULAR
  Administered 2014-12-14 (×4): 1 mg via INTRAMUSCULAR
  Filled 2014-12-13 (×2): qty 1
  Filled 2014-12-13: qty 2
  Filled 2014-12-13 (×5): qty 1

## 2014-12-13 MED ORDER — ONDANSETRON HCL 4 MG/2ML IJ SOLN
INTRAMUSCULAR | Status: DC | PRN
  Administered 2014-12-13: 4 mg via INTRAVENOUS

## 2014-12-13 MED ORDER — CEFAZOLIN SODIUM-DEXTROSE 2-3 GM-% IV SOLR
2.0000 g | INTRAVENOUS | Status: AC
  Administered 2014-12-13: 2 g via INTRAVENOUS

## 2014-12-13 MED ORDER — ACETAMINOPHEN 650 MG RE SUPP
650.0000 mg | RECTAL | Status: DC | PRN
  Filled 2014-12-13: qty 1

## 2014-12-13 MED ORDER — PROPOFOL 1000 MG/100ML IV EMUL
INTRAVENOUS | Status: AC
  Administered 2014-12-13: 50 ug/kg/min via INTRAVENOUS
  Filled 2014-12-13: qty 200

## 2014-12-13 MED ORDER — DULOXETINE HCL 30 MG PO CPEP
30.0000 mg | ORAL_CAPSULE | Freq: Two times a day (BID) | ORAL | Status: DC
  Administered 2014-12-13 – 2014-12-15 (×4): 30 mg via ORAL
  Filled 2014-12-13 (×5): qty 1

## 2014-12-13 MED ORDER — GABAPENTIN 600 MG PO TABS
600.0000 mg | ORAL_TABLET | Freq: Three times a day (TID) | ORAL | Status: DC
  Administered 2014-12-13 – 2014-12-15 (×6): 600 mg via ORAL
  Filled 2014-12-13 (×8): qty 1

## 2014-12-13 MED ORDER — AMLODIPINE BESYLATE 2.5 MG PO TABS
1.2500 mg | ORAL_TABLET | Freq: Two times a day (BID) | ORAL | Status: DC
  Administered 2014-12-13: 1.25 mg via ORAL
  Filled 2014-12-13 (×3): qty 0.5

## 2014-12-13 MED ORDER — SODIUM CHLORIDE 0.9 % IJ SOLN
INTRAMUSCULAR | Status: AC
  Filled 2014-12-13: qty 10

## 2014-12-13 MED ORDER — BUPIVACAINE HCL (PF) 0.25 % IJ SOLN
INTRAMUSCULAR | Status: DC | PRN
  Administered 2014-12-13: 8 mL

## 2014-12-13 MED ORDER — KCL IN DEXTROSE-NACL 20-5-0.45 MEQ/L-%-% IV SOLN
80.0000 mL/h | INTRAVENOUS | Status: DC
  Filled 2014-12-13 (×5): qty 1000

## 2014-12-13 MED ORDER — EPHEDRINE SULFATE 50 MG/ML IJ SOLN
INTRAMUSCULAR | Status: AC
  Filled 2014-12-13: qty 1

## 2014-12-13 MED ORDER — DEXAMETHASONE SODIUM PHOSPHATE 10 MG/ML IJ SOLN
INTRAMUSCULAR | Status: AC
  Filled 2014-12-13: qty 1

## 2014-12-13 MED ORDER — ROCURONIUM BROMIDE 50 MG/5ML IV SOLN
INTRAVENOUS | Status: AC
  Filled 2014-12-13: qty 1

## 2014-12-13 MED ORDER — STERILE WATER FOR INJECTION IJ SOLN
INTRAMUSCULAR | Status: AC
  Filled 2014-12-13: qty 10

## 2014-12-13 MED ORDER — HYDROMORPHONE HCL 1 MG/ML IJ SOLN
0.2500 mg | INTRAMUSCULAR | Status: DC | PRN
  Administered 2014-12-13 (×4): 0.5 mg via INTRAVENOUS

## 2014-12-13 MED ORDER — EPHEDRINE SULFATE 50 MG/ML IJ SOLN
INTRAMUSCULAR | Status: DC | PRN
  Administered 2014-12-13: 5 mg via INTRAVENOUS

## 2014-12-13 MED ORDER — MEPERIDINE HCL 25 MG/ML IJ SOLN: 6.2500 mg | INTRAMUSCULAR | Status: DC | PRN

## 2014-12-13 MED ORDER — ACETAMINOPHEN 325 MG PO TABS: 650.0000 mg | ORAL_TABLET | ORAL | Status: DC | PRN

## 2014-12-13 MED ORDER — 0.9 % SODIUM CHLORIDE (POUR BTL) OPTIME
TOPICAL | Status: DC | PRN
  Administered 2014-12-13: 1000 mL

## 2014-12-13 MED ORDER — ROCURONIUM BROMIDE 100 MG/10ML IV SOLN
INTRAVENOUS | Status: DC | PRN
  Administered 2014-12-13: 40 mg via INTRAVENOUS

## 2014-12-13 MED ORDER — NITROGLYCERIN 0.4 MG SL SUBL: 0.4000 mg | SUBLINGUAL_TABLET | SUBLINGUAL | Status: DC | PRN

## 2014-12-13 MED ORDER — ATORVASTATIN CALCIUM 80 MG PO TABS
80.0000 mg | ORAL_TABLET | Freq: Every day | ORAL | Status: DC
  Administered 2014-12-14 – 2014-12-15 (×2): 80 mg via ORAL
  Filled 2014-12-13 (×2): qty 1

## 2014-12-13 MED ORDER — GLYCOPYRROLATE 0.2 MG/ML IJ SOLN
INTRAMUSCULAR | Status: DC | PRN
  Administered 2014-12-13: .7 mg via INTRAVENOUS

## 2014-12-13 MED ORDER — SODIUM CHLORIDE 0.9 % IJ SOLN
3.0000 mL | Freq: Two times a day (BID) | INTRAMUSCULAR | Status: DC
  Administered 2014-12-13: 3 mL via INTRAVENOUS

## 2014-12-13 MED ORDER — ONDANSETRON HCL 4 MG/2ML IJ SOLN
INTRAMUSCULAR | Status: AC
  Filled 2014-12-13: qty 2

## 2014-12-13 MED ORDER — CEFAZOLIN SODIUM-DEXTROSE 2-3 GM-% IV SOLR
INTRAVENOUS | Status: AC
  Filled 2014-12-13: qty 50

## 2014-12-13 MED ORDER — SUCCINYLCHOLINE CHLORIDE 20 MG/ML IJ SOLN
INTRAMUSCULAR | Status: DC | PRN
  Administered 2014-12-13: 140 mg via INTRAVENOUS

## 2014-12-13 MED ORDER — SODIUM CHLORIDE 0.9 % IV SOLN: 250.0000 mL | INTRAVENOUS | Status: DC

## 2014-12-13 MED ORDER — PANTOPRAZOLE SODIUM 40 MG IV SOLR
40.0000 mg | Freq: Every day | INTRAVENOUS | Status: DC
  Filled 2014-12-13: qty 40

## 2014-12-13 MED ORDER — MIDAZOLAM HCL 2 MG/2ML IJ SOLN
INTRAMUSCULAR | Status: AC
  Filled 2014-12-13: qty 2

## 2014-12-13 MED ORDER — ARTIFICIAL TEARS OP OINT
TOPICAL_OINTMENT | OPHTHALMIC | Status: AC
  Filled 2014-12-13: qty 3.5

## 2014-12-13 MED ORDER — SODIUM CHLORIDE 0.9 % IR SOLN
Status: DC | PRN
  Administered 2014-12-13 (×2): 500 mL

## 2014-12-13 MED ORDER — LACTATED RINGERS IV SOLN
INTRAVENOUS | Status: DC | PRN
  Administered 2014-12-13 (×4): via INTRAVENOUS

## 2014-12-13 MED ORDER — METOPROLOL TARTRATE 25 MG PO TABS
25.0000 mg | ORAL_TABLET | Freq: Every day | ORAL | Status: DC
  Administered 2014-12-13 – 2014-12-15 (×3): 25 mg via ORAL
  Filled 2014-12-13 (×3): qty 1

## 2014-12-13 MED ORDER — METOPROLOL TARTRATE 1 MG/ML IV SOLN
INTRAVENOUS | Status: DC | PRN
  Administered 2014-12-13: 2.5 mg via INTRAVENOUS

## 2014-12-13 MED ORDER — NEOSTIGMINE METHYLSULFATE 10 MG/10ML IV SOLN
INTRAVENOUS | Status: DC | PRN
  Administered 2014-12-13: 5 mg via INTRAVENOUS

## 2014-12-13 MED ORDER — DOCUSATE SODIUM 100 MG PO CAPS
100.0000 mg | ORAL_CAPSULE | Freq: Two times a day (BID) | ORAL | Status: DC
  Administered 2014-12-13 – 2014-12-15 (×5): 100 mg via ORAL
  Filled 2014-12-13 (×5): qty 1

## 2014-12-13 MED ORDER — ONDANSETRON HCL 4 MG/2ML IJ SOLN
4.0000 mg | Freq: Once | INTRAMUSCULAR | Status: AC | PRN
  Administered 2014-12-13: 4 mg via INTRAVENOUS

## 2014-12-13 MED ORDER — PHENOL 1.4 % MT LIQD: 1.0000 | OROMUCOSAL | Status: DC | PRN

## 2014-12-13 MED ORDER — CYCLOBENZAPRINE HCL 10 MG PO TABS
ORAL_TABLET | ORAL | Status: AC
  Filled 2014-12-13: qty 1

## 2014-12-13 MED ORDER — PROPOFOL 10 MG/ML IV BOLUS
INTRAVENOUS | Status: DC | PRN
  Administered 2014-12-13: 150 mg via INTRAVENOUS

## 2014-12-13 MED ORDER — PHENYLEPHRINE HCL 10 MG/ML IJ SOLN
INTRAMUSCULAR | Status: DC | PRN
  Administered 2014-12-13: 80 ug via INTRAVENOUS
  Administered 2014-12-13: 40 ug via INTRAVENOUS
  Administered 2014-12-13 (×2): 80 ug via INTRAVENOUS
  Administered 2014-12-13: 40 ug via INTRAVENOUS
  Administered 2014-12-13 (×2): 80 ug via INTRAVENOUS

## 2014-12-13 MED ORDER — OXYCODONE-ACETAMINOPHEN 5-325 MG PO TABS
1.0000 | ORAL_TABLET | ORAL | Status: DC | PRN
  Administered 2014-12-13 – 2014-12-14 (×8): 2 via ORAL
  Filled 2014-12-13 (×7): qty 2

## 2014-12-13 MED ORDER — MIDAZOLAM HCL 5 MG/5ML IJ SOLN
INTRAMUSCULAR | Status: DC | PRN
  Administered 2014-12-13: 2 mg via INTRAVENOUS

## 2014-12-13 MED ORDER — MENTHOL 3 MG MT LOZG: 1.0000 | LOZENGE | OROMUCOSAL | Status: DC | PRN

## 2014-12-13 MED ORDER — SODIUM CHLORIDE 0.9 % IJ SOLN: 3.0000 mL | INTRAMUSCULAR | Status: DC | PRN

## 2014-12-13 MED ORDER — PANTOPRAZOLE SODIUM 40 MG PO TBEC
40.0000 mg | DELAYED_RELEASE_TABLET | Freq: Every day | ORAL | Status: DC
  Administered 2014-12-13 – 2014-12-14 (×2): 40 mg via ORAL
  Filled 2014-12-13 (×2): qty 1

## 2014-12-13 MED ORDER — ONDANSETRON HCL 4 MG/2ML IJ SOLN: 4.0000 mg | INTRAMUSCULAR | Status: DC | PRN

## 2014-12-13 MED ORDER — ALBUTEROL SULFATE (2.5 MG/3ML) 0.083% IN NEBU: 3.0000 mL | INHALATION_SOLUTION | Freq: Four times a day (QID) | RESPIRATORY_TRACT | Status: DC | PRN

## 2014-12-13 SURGICAL SUPPLY — 82 items
APL SKNCLS STERI-STRIP NONHPOA (GAUZE/BANDAGES/DRESSINGS) ×2
BAG DECANTER FOR FLEXI CONT (MISCELLANEOUS) ×10 IMPLANT
BENZOIN TINCTURE PRP APPL 2/3 (GAUZE/BANDAGES/DRESSINGS) ×10 IMPLANT
BLADE CLIPPER SURG (BLADE) IMPLANT
BONE EQUIVA 10CC (Bone Implant) ×2 IMPLANT
BRUSH SCRUB EZ PLAIN DRY (MISCELLANEOUS) ×4 IMPLANT
CLOSURE WOUND 1/2 X4 (GAUZE/BANDAGES/DRESSINGS) ×1
CONT SPEC 4OZ CLIKSEAL STRL BL (MISCELLANEOUS) ×6 IMPLANT
COVER BACK TABLE 24X17X13 BIG (DRAPES) IMPLANT
COVER BACK TABLE 60X90IN (DRAPES) ×4 IMPLANT
DRAPE C-ARM 42X72 X-RAY (DRAPES) ×8 IMPLANT
DRAPE C-ARMOR (DRAPES) ×8 IMPLANT
DRAPE LAPAROTOMY 100X72X124 (DRAPES) ×10 IMPLANT
DRAPE SURG 17X23 STRL (DRAPES) ×20 IMPLANT
DRSG OPSITE 4X5.5 SM (GAUZE/BANDAGES/DRESSINGS) ×2 IMPLANT
DRSG OPSITE POSTOP 3X4 (GAUZE/BANDAGES/DRESSINGS) IMPLANT
DRSG OPSITE POSTOP 4X6 (GAUZE/BANDAGES/DRESSINGS) ×4 IMPLANT
DRSG TELFA 3X8 NADH (GAUZE/BANDAGES/DRESSINGS) ×4 IMPLANT
DURAPREP 26ML APPLICATOR (WOUND CARE) ×6 IMPLANT
ELECT BLADE 4.0 EZ CLEAN MEGAD (MISCELLANEOUS) ×8
ELECT REM PT RETURN 9FT ADLT (ELECTROSURGICAL) ×8
ELECTRODE BLDE 4.0 EZ CLN MEGD (MISCELLANEOUS) ×4 IMPLANT
ELECTRODE REM PT RTRN 9FT ADLT (ELECTROSURGICAL) ×6 IMPLANT
EVACUATOR 1/8 PVC DRAIN (DRAIN) IMPLANT
GAUZE SPONGE 4X4 12PLY STRL (GAUZE/BANDAGES/DRESSINGS) ×6 IMPLANT
GAUZE SPONGE 4X4 16PLY XRAY LF (GAUZE/BANDAGES/DRESSINGS) IMPLANT
GLOVE BIO SURGEON STRL SZ8 (GLOVE) IMPLANT
GLOVE BIOGEL PI IND STRL 7.0 (GLOVE) IMPLANT
GLOVE BIOGEL PI IND STRL 7.5 (GLOVE) IMPLANT
GLOVE BIOGEL PI IND STRL 8 (GLOVE) IMPLANT
GLOVE BIOGEL PI INDICATOR 7.0 (GLOVE) ×2
GLOVE BIOGEL PI INDICATOR 7.5 (GLOVE) ×2
GLOVE BIOGEL PI INDICATOR 8 (GLOVE) ×4
GLOVE ECLIPSE 7.0 STRL STRAW (GLOVE) ×2 IMPLANT
GLOVE ECLIPSE 7.5 STRL STRAW (GLOVE) ×8 IMPLANT
GLOVE ECLIPSE 8.0 STRL XLNG CF (GLOVE) ×12 IMPLANT
GLOVE EXAM NITRILE LRG STRL (GLOVE) IMPLANT
GLOVE EXAM NITRILE XL STR (GLOVE) IMPLANT
GLOVE EXAM NITRILE XS STR PU (GLOVE) IMPLANT
GOWN STRL REUS W/ TWL LRG LVL3 (GOWN DISPOSABLE) ×2 IMPLANT
GOWN STRL REUS W/ TWL XL LVL3 (GOWN DISPOSABLE) IMPLANT
GOWN STRL REUS W/TWL 2XL LVL3 (GOWN DISPOSABLE) ×8 IMPLANT
GOWN STRL REUS W/TWL LRG LVL3 (GOWN DISPOSABLE)
GOWN STRL REUS W/TWL XL LVL3 (GOWN DISPOSABLE) ×8
GUIDEWIRES (WIRE) ×4 IMPLANT
K-WIRE NITHNOL TROCAR TIP (WIRE) ×2 IMPLANT
KIT ACCESS (KITS) ×2 IMPLANT
KIT BASIN OR (CUSTOM PROCEDURE TRAY) ×10 IMPLANT
KIT NEURO (KITS) ×2 IMPLANT
KIT ROOM TURNOVER OR (KITS) ×6 IMPLANT
LIQUID BAND (GAUZE/BANDAGES/DRESSINGS) ×10 IMPLANT
NEEDLE HYPO 22GX1.5 SAFETY (NEEDLE) ×10 IMPLANT
NEEDLE TARGETING (NEEDLE) ×2 IMPLANT
NS IRRIG 1000ML POUR BTL (IV SOLUTION) ×8 IMPLANT
PACK LAMINECTOMY NEURO (CUSTOM PROCEDURE TRAY) ×10 IMPLANT
PAD DRESSING TELFA 3X8 NADH (GAUZE/BANDAGES/DRESSINGS) ×6 IMPLANT
PLATE COVER TIMBERLINE MPF (Plate) ×2 IMPLANT
PLATE MPF 2H 10MM (Plate) ×2 IMPLANT
RASP 3.0MM (RASP) ×2 IMPLANT
ROD PATHFINDER 40MM (Rod) ×2 IMPLANT
SCREW PATHFINDER 7.5X45 (Screw) ×2 IMPLANT
SCREW POLYAXIA MIS 6.5X40MM (Screw) ×2 IMPLANT
SCREW SELFTAP VAR 5.5X45MM MPF (Screw) ×4 IMPLANT
SHEATH PAT (SHEATH) ×2 IMPLANT
SPACER TIMBERLINE 18X12X50-8 (Spacer) ×2 IMPLANT
SPONGE LAP 4X18 X RAY DECT (DISPOSABLE) IMPLANT
SPONGE SURGIFOAM ABS GEL 100 (HEMOSTASIS) ×2 IMPLANT
SPONGE SURGIFOAM ABS GEL SZ50 (HEMOSTASIS) IMPLANT
STAPLER SKIN PROX WIDE 3.9 (STAPLE) ×2 IMPLANT
STRIP CLOSURE SKIN 1/2X4 (GAUZE/BANDAGES/DRESSINGS) ×3 IMPLANT
SUT VIC AB 0 OS6 3-18 (SUTURE) ×2 IMPLANT
SUT VIC AB 2-0 OS6 18 (SUTURE) ×40 IMPLANT
SUT VIC AB 3-0 CP2 18 (SUTURE) ×20 IMPLANT
SYR 20ML ECCENTRIC (SYRINGE) ×6 IMPLANT
TAPE CLOTH 3X10 TAN LF (GAUZE/BANDAGES/DRESSINGS) ×8 IMPLANT
TOP CLSR SEQUOIA (Orthopedic Implant) ×4 IMPLANT
TOWEL OR 17X24 6PK STRL BLUE (TOWEL DISPOSABLE) ×4 IMPLANT
TOWEL OR 17X26 10 PK STRL BLUE (TOWEL DISPOSABLE) ×10 IMPLANT
TRAP SPECIMEN MUCOUS 40CC (MISCELLANEOUS) IMPLANT
TRAY FOLEY CATH 16FRSI W/METER (SET/KITS/TRAYS/PACK) ×2 IMPLANT
TRAY FOLEY W/METER SILVER 14FR (SET/KITS/TRAYS/PACK) ×2 IMPLANT
WATER STERILE IRR 1000ML POUR (IV SOLUTION) ×8 IMPLANT

## 2014-12-13 NOTE — Anesthesia Postprocedure Evaluation (Signed)
Anesthesia Post Note  Patient: [name]  Procedure(s) Performed: Procedure(s) (LRB):  EXTREME LUMBAR INTERBODY FUSION LUMBAR THREE-FOUR,LEFT SIDED APPROACH WITH UNILATERAL PEDICLE SCREWS LUMBAR THREE-FOUR LEFT;LATERAL PLATE (Left) LUMBAR PERCUTANEOUS PEDICLE SCREW LUMBAR THREE-FOUR (N/A)  Anesthesia type: general  Patient location: PACU  Post pain: Pain level controlled  Post assessment: Patient's Cardiovascular Status Stable  Last Vitals:  Filed Vitals:   12/13/14 1331  BP:   Pulse: 87  Temp: 36.5 C  Resp: 12    Post vital signs: Reviewed and stable  Level of consciousness: sedated  Complications: No apparent anesthesia complications
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
## 2014-12-13 NOTE — Anesthesia Procedure Notes (Signed)
Procedure Name: Intubation Date/Time: 12/13/2014 7:40 AM Performed by: Reine Just Pre-anesthesia Checklist: Patient identified, Emergency Drugs available, Suction available, Patient being monitored and Timeout performed Patient Re-evaluated:Patient Re-evaluated prior to inductionOxygen Delivery Method: Circle system utilized and Simple face mask Preoxygenation: Pre-oxygenation with 100% oxygen Intubation Type: IV induction Ventilation: Mask ventilation without difficulty Laryngoscope Size: Mac and 4 Grade View: Grade I Tube type: Oral Tube size: 7.5 mm Number of attempts: 1 Airway Equipment and Method: Patient positioned with wedge pillow and Stylet Placement Confirmation: ETT inserted through vocal cords under direct vision,  positive ETCO2 and breath sounds checked- equal and bilateral Secured at: 23 cm Tube secured with: Tape Dental Injury: Teeth and Oropharynx as per pre-operative assessment

## 2014-12-13 NOTE — Progress Notes (Signed)
Utilization review completed.  

## 2014-12-13 NOTE — Anesthesia Preprocedure Evaluation (Addendum)
Anesthesia Evaluation  Patient identified by MRN, date of birth, ID band Patient awake    Reviewed: Allergy & Precautions, NPO status , Patient's Chart, lab work & pertinent test results, reviewed documented beta blocker date and time   Airway Mallampati: I  TM Distance: >3 FB Neck ROM: Full    Dental  (+) Partial Upper, Dental Advisory Given   Pulmonary COPDCurrent Smoker,    Pulmonary exam normal       Cardiovascular hypertension, Pt. on medications + CAD, + Past MI and + Cardiac Stents Normal cardiovascular exam    Neuro/Psych    GI/Hepatic   Endo/Other    Renal/GU      Musculoskeletal   Abdominal   Peds  Hematology   Anesthesia Other Findings   Reproductive/Obstetrics                            Anesthesia Physical Anesthesia Plan  ASA: III  Anesthesia Plan: General   Post-op Pain Management:    Induction: Intravenous  Airway Management Planned: Oral ETT  Additional Equipment:   Intra-op Plan:   Post-operative Plan: Extubation in OR  Informed Consent: I have reviewed the patients History and Physical, chart, labs and discussed the procedure including the risks, benefits and alternatives for the proposed anesthesia with the patient or authorized representative who has indicated his/her understanding and acceptance.     Plan Discussed with: CRNA and Surgeon  Anesthesia Plan Comments:         Anesthesia Quick Evaluation

## 2014-12-13 NOTE — Transfer of Care (Signed)
Immediate Anesthesia Transfer of Care Note  Patient: [name]  Procedure(s) Performed: Procedure(s) with comments:  EXTREME LUMBAR INTERBODY FUSION LUMBAR THREE-FOUR,LEFT SIDED APPROACH WITH UNILATERAL PEDICLE SCREWS LUMBAR THREE-FOUR LEFT;LATERAL PLATE (Left) - left LUMBAR PERCUTANEOUS PEDICLE SCREW LUMBAR THREE-FOUR (N/A)  Patient Location: PACU  Anesthesia Type:General  Level of Consciousness: awake, alert  and oriented  Airway & Oxygen Therapy: Patient Spontanous Breathing and Patient connected to nasal cannula oxygen  Post-op Assessment: Report given to RN, Post -op Vital signs reviewed and stable and Patient moving all extremities X 4  Post vital signs: Reviewed and stable  Last Vitals:  Filed Vitals:   12/13/14 1200  BP:   Pulse: 105  Temp:   Resp: 13    Complications: No apparent anesthesia complications

## 2014-12-13 NOTE — Op Note (Signed)
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 Linear incision was made above the disc space at L3-4 carried down to the muscle. We then made a second incision posterior to its used finger dissection to easily enter the retroperitoneal space. Finger was turned upward to allow entry into the retroperitoneum from the more flank incision. We then passed our first dilator through the so as muscle and used monitoring which showed no abnormal readings. We did sequential dilation continue with EMG monitoring which showed no abnormal readings. We then placed a retractor and secured to the bed in standard fashion. We opened the retractor slightly confirmed good position in AP lateral fluoroscopy and did additional EMG testing which showed good readings. We secured the retractor to the disc space with the shim and then opened it further. We coagulated any residual muscle on the disc space and then incised the disc with a 15 blade. We thoroughly cleaned out with a variety of instruments and release the annulus on the opposite side with the Cobb elevator. We then did sequential distraction. We trialed the 12 mm lordotic graft we felt that this was a good fit. We chose a 12 x 18 x 50 mm graft and filled it with morselized allograft. We confirmed once again that the disc space was ready for fusion and then impacted the graft without difficulty. We followed it into excellent position confirmed by AP and lateral fluoroscopy but left a few millimeters proud. We then  secured the lateral plate and then impacted the rest of the way. We passed our drill through our guide and then placed 45 mm screws through the lateral plate without difficulty. We then placed the locking device to block any chance of the screws backing out. Fluoroscopy in AP lateral direction looked excellent. We irrigated the wound removed the retractor and closed it with inverted Vicryls on the fascia subcutaneous and subcuticular tissues. Dermabond was placed on the skin. We then turned the patient into the prone position. His back was prepped and draped in the usual sterile fashion. We used AP fluoroscopy to identify the pedicle of L3 and made a stab wound just lateral to it. We placed the Jamshidi needle from lateral to medial direction without difficulty through the pedicle. We then tapped with a 6 mm tap and then placed a 6.5 x 40 mm screw without difficulty. We then extended our incision down to the L4 screw. We dissected down through the soft tissue until the screw head and the rod were easily identified. We removed the top loading And then used a metal cutting bur to drill through the rod between the L4 and L5 screw heads. We then removed the residual rod. We removed the old screw and placed a 7.5 mm x 45 mm screw down the same hole and followed into excellent position under fluoroscopy. We then chose appropriate length rod secured at the top of the screws placed top loading nuts and did tightening and final tightening with torque and counter torque. Final fluoroscopy in AP lateral direction looked excellent. The was then irrigated and closed in multiple layers of Vicryls.  We placed Dermabond and Steri-Strips on the skin. Sterile dressing was then applied and the patient was extubated and taken to recovery room in stable condition.

## 2014-12-14 ENCOUNTER — Encounter (HOSPITAL_COMMUNITY): Payer: Self-pay | Admitting: Neurosurgery

## 2014-12-14 MED ORDER — HYDROMORPHONE HCL 2 MG PO TABS
2.0000 mg | ORAL_TABLET | ORAL | Status: DC | PRN
  Administered 2014-12-14 – 2014-12-15 (×3): 2 mg via ORAL
  Filled 2014-12-14 (×3): qty 1

## 2014-12-14 MED ORDER — AMLODIPINE 1 MG/ML ORAL SUSPENSION
1.2500 mg | Freq: Two times a day (BID) | ORAL | Status: DC
  Administered 2014-12-14 – 2014-12-15 (×3): 1.3 mg via ORAL
  Filled 2014-12-14 (×4): qty 1.3

## 2014-12-14 NOTE — Progress Notes (Signed)
Patient ID: [name], male   DOB: Dec 20, 1960, 54 y.o.   MRN: [id number] Afeb, vss Complains of incisional pain. Wounds clean and dry. Will increase activity today, and hope for d/c tomorrow.

## 2014-12-15 ENCOUNTER — Encounter (HOSPITAL_COMMUNITY): Payer: Self-pay | Admitting: Neurosurgery

## 2014-12-15 NOTE — Discharge Summary (Signed)
  Physician Discharge Summary  Patient ID: [name] MRN: [id number] DOB/AGE: 1960/07/22 54 y.o.  Admit date: 12/13/2014 Discharge date: 12/15/2014  Admission Diagnoses:  Discharge Diagnoses:  Active Problems:   Spinal stenosis, lumbar   Discharged Condition: good  Hospital Course: Surgery 2 days ago for L 34 lateral fusion for adjacent level disease. Did fairly well. Complained of a bit more incisional pain than usual, but ambulted well by day 2 and pain controlled with oral meds. Wound healing well. Home with specific instructions given.  Consults: None  Significant Diagnostic Studies: none  Treatments: surgery: L 34 lateral fusion with pedicle screws  Discharge Exam: Blood pressure 149/78, pulse 91, temperature 97.9 F (36.6 C), temperature source Oral, resp. rate 20, height  (1.803 m), weight 70.761 kg (156 lb), SpO2 97 %. Incision/Wound:clean and dry; no new neuro issues  Disposition:      Medication List    ASK your doctor about these medications        albuterol 108 (90 BASE) MCG/ACT inhaler  Commonly known as:  PROVENTIL HFA;VENTOLIN HFA  Inhale 1-2 puffs into the lungs every 6 (six) hours as needed for wheezing or shortness of breath.     amLODipine 2.5 MG tablet  Commonly known as:  NORVASC  Take 1 tablet (2.5 mg total) by mouth daily.     aspirin EC 81 MG tablet  Take 81 mg by mouth daily.     atorvastatin 80 MG tablet  Commonly known as:  LIPITOR  Take 1 tablet (80 mg total) by mouth daily.     cyclobenzaprine 10 MG tablet  Commonly known as:  FLEXERIL  Take 10 mg by mouth 2 (two) times daily.     DULoxetine 30 MG capsule  Commonly known as:  CYMBALTA  Take 30 mg by mouth 2 (two) times daily.     gabapentin 600 MG tablet  Commonly known as:  NEURONTIN  Take 600 mg by mouth 3 (three) times daily.     INCRUSE ELLIPTA 62.5 MCG/INH Aepb  Generic drug:  Umeclidinium Bromide  Inhale 1 puff into the lungs daily.     metoprolol  tartrate 25 MG tablet  Commonly known as:  LOPRESSOR  TAKE 1 TABLET BY MOUTH ONCE DAILY     NITROSTAT 0.4 MG SL tablet  Generic drug:  nitroGLYCERIN  1 TABLET UNDER TONGUE EVERY 5 MINUTES AS NEEDED     Oxycodone HCl 10 MG Tabs  Take 20 mg by mouth every 4 (four) hours as needed (pain).         At home rest most of the time. Get up 9 or 10 times each day and take a 15 or 20 minute walk. No riding in the car and to your first postoperative appointment. If you have neck surgery you may shower from the chest down starting on the third postoperative day. If you had back surgery he may start showering on the third postoperative day with saran wrap wrapped around your incisional area 3 times. After the shower remove the saran wrap. Take pain medicine as needed and other medications as instructed. Call my office for an appointment.  SignedReinaldo Meeker, MD 12/15/2014, 9:42 AM

## 2014-12-15 NOTE — Progress Notes (Signed)
Patient alert and oriented, mae's well, voiding adequate amount of urine, swallowing without difficulty, c/o moderate pain and meds given prior to discharged. Patient discharged home with family. Script and discharged instructions given to patient. Patient and family stated understanding of instructions given.  

## 2015-02-13 ENCOUNTER — Other Ambulatory Visit: Payer: Self-pay | Admitting: Cardiology

## 2015-02-14 ENCOUNTER — Other Ambulatory Visit: Payer: Self-pay | Admitting: *Deleted

## 2015-02-14 MED ORDER — NITROGLYCERIN 0.4 MG SL SUBL: 0.4000 mg | SUBLINGUAL_TABLET | SUBLINGUAL | Status: AC | PRN

## 2015-02-16 ENCOUNTER — Other Ambulatory Visit: Payer: Self-pay | Admitting: Cardiology

## 2015-03-02 ENCOUNTER — Encounter: Payer: Self-pay | Admitting: *Deleted

## 2015-03-02 ENCOUNTER — Ambulatory Visit (INDEPENDENT_AMBULATORY_CARE_PROVIDER_SITE_OTHER): Payer: Medicare Other | Admitting: Cardiology

## 2015-03-02 ENCOUNTER — Encounter: Payer: Self-pay | Admitting: Cardiology

## 2015-03-02 VITALS — BP 122/80 | HR 72 | Ht 72.0 in | Wt 159.4 lb

## 2015-03-02 DIAGNOSIS — I2510 Atherosclerotic heart disease of native coronary artery without angina pectoris: Secondary | ICD-10-CM | POA: Diagnosis not present

## 2015-03-02 DIAGNOSIS — Z23 Encounter for immunization: Secondary | ICD-10-CM

## 2015-03-02 NOTE — Patient Instructions (Signed)
Your physician wants you to follow-up in: 1 year with Dr. Lurena JoinerBranch  You will receive a reminder letter in the mail two months in advance. If you don't receive a letter, please call our office to schedule the follow-up appointment.  Your physician has recommended you make the following change in your medication:   START ASPIRIN 81 MG DAILY  WE WILL REQUEST LABS   Thank you for choosing [facility]!!

## 2015-03-02 NOTE — Progress Notes (Signed)
Patient ID: [name], male   DOB: Jan 10, 1961, 54 y.o.   MRN: [id number]     Clinical Summary [name] is a 54 y.o.male seen today for follow up of the following medical problems.   1. CAD - history of prior stenting to RCA and LAD - last cath 2009, 3 vessel non-obstructive disease with patent LAD and RCA stents, stable ostial D2 lesion, LVEF 50% -  01/2014 lexiscan without evidence of ischemia. Echo LVEF 55%, , no WMAs.  - denies any significant chest pain.    Past Medical History  Diagnosis Date  . CAD (coronary artery disease)   . HLD (hyperlipidemia)   . Back injury     crushing  . H/O right coronary artery stent placement 2007  . Myocardial infarction 2007, 2009  . Hypertension   . COPD (chronic obstructive pulmonary disease)   . Emphysema/COPD   . Pleurisy   . History of stomach ulcers   . H/O hernia repair   . Arthritis      Allergies  Allergen Reactions  . Apple Anaphylaxis    Throat swelling.   Marland Kitchen. Peppermint Flavor Hives and Itching     Current Outpatient Prescriptions  Medication Sig Dispense Refill  . albuterol (PROVENTIL HFA;VENTOLIN HFA) 108 (90 BASE) MCG/ACT inhaler Inhale 1-2 puffs into the lungs every 6 (six) hours as needed for wheezing or shortness of breath.    Marland Kitchen. amLODipine (NORVASC) 2.5 MG tablet Take 1 tablet (2.5 mg total) by mouth daily. (Patient taking differently: Take 1.25 mg by mouth 2 (two) times daily. ) 30 tablet 6  . atorvastatin (LIPITOR) 80 MG tablet Take 1 tablet (80 mg total) by mouth daily. 30 tablet 6  . cyclobenzaprine (FLEXERIL) 10 MG tablet Take 10 mg by mouth 2 (two) times daily.     . DULoxetine (CYMBALTA) 30 MG capsule Take 30 mg by mouth 2 (two) times daily.     Marland Kitchen. gabapentin (NEURONTIN) 600 MG tablet Take 600 mg by mouth 3 (three) times daily.     . metoprolol tartrate (LOPRESSOR) 25 MG tablet TAKE 1 TABLET BY MOUTH EVERY DAY -- PT NEED APPOINTMENT W/ DR BEFORE REFILL/CONTACTED PT/SAR 02-13-15 30 tablet 2  .  nitroGLYCERIN (NITROSTAT) 0.4 MG SL tablet Place 1 tablet (0.4 mg total) under the tongue every 5 (five) minutes x 3 doses as needed for chest pain. If no relief after 3 rd dose, proceed to the ED for an evalutaion. 25 tablet 0  . Oxycodone HCl 10 MG TABS Take 20 mg by mouth every 4 (four) hours as needed (pain).    Marland Kitchen. Umeclidinium Bromide (INCRUSE ELLIPTA) 62.5 MCG/INH AEPB Inhale 1 puff into the lungs daily.     No current facility-administered medications for this visit.     Past Surgical History  Procedure Laterality Date  . Back surgery      two prior; spinal rod  . Tonsillectomy    . Appendectomy    . Tooth extraction    . Colonoscopy    . Anterior lateral lumbar fusion with percutaneous screw 1 level Left 12/13/2014    Procedure:  EXTREME LUMBAR INTERBODY FUSION LUMBAR THREE-FOUR,LEFT SIDED APPROACH WITH UNILATERAL PEDICLE SCREWS LUMBAR THREE-FOUR LEFT;LATERAL PLATE;  Surgeon: Aliene Beamsandy Kritzer, MD;  Location: [facility];  Service: Neurosurgery;  Laterality: Left;  left  . Lumbar percutaneous pedicle screw 1 level N/A 12/13/2014    Procedure: LUMBAR PERCUTANEOUS PEDICLE SCREW LUMBAR THREE-FOUR;  Surgeon: Aliene Beamsandy Kritzer, MD;  Location: [facility];  Service: Neurosurgery;  Laterality: N/A;     Allergies  Allergen Reactions  . Apple Anaphylaxis    Throat swelling.   Marland Kitchen Peppermint Flavor Hives and Itching      Family History  Problem Relation Age of Onset  . Coronary artery disease       Social History Mr. Cianci reports that he has been smoking Cigarettes.  He started smoking about 40 years ago. He has a 20 pack-year smoking history. He does not have any smokeless tobacco history on file. Mr. Cleckler reports that he does not drink alcohol.   Review of Systems CONSTITUTIONAL: No weight loss, fever, chills, weakness or fatigue.  HEENT: Eyes: No visual loss, blurred vision, double vision or yellow sclerae.No hearing loss, sneezing, congestion, runny nose or sore  throat.  SKIN: No rash or itching.  CARDIOVASCULAR: per hpi RESPIRATORY: No shortness of breath, cough or sputum.  GASTROINTESTINAL: No anorexia, nausea, vomiting or diarrhea. No abdominal pain or blood.  GENITOURINARY: No burning on urination, no polyuria NEUROLOGICAL: No headache, dizziness, syncope, paralysis, ataxia, numbness or tingling in the extremities. No change in bowel or bladder control.  MUSCULOSKELETAL: No muscle, back pain, joint pain or stiffness.  LYMPHATICS: No enlarged nodes. No history of splenectomy.  PSYCHIATRIC: No history of depression or anxiety.  ENDOCRINOLOGIC: No reports of sweating, cold or heat intolerance. No polyuria or polydipsia.  Marland Kitchen   Physical Examination Filed Vitals:   03/02/15 1512  BP: 122/80  Pulse: 72   Filed Vitals:   03/02/15 1512  Height: 6' (1.829 m)  Weight: 159 lb 6.4 oz (72.303 kg)    Gen: resting comfortably, no acute distress HEENT: no scleral icterus, pupils equal round and reactive, no palptable cervical adenopathy,  CV: RRR, no m/r/g, no jvd Resp: Clear to auscultation bilaterally GI: abdomen is soft, non-tender, non-distended, normal bowel sounds, no hepatosplenomegaly MSK: extremities are warm, no edema.  Skin: warm, no rash Neuro:  no focal deficits Psych: appropriate affect   Diagnostic Studies 01/2014 MPI IMPRESSION: 1. No reversible ischemia or infarction.  2. There is global hypokinesis with moderate left ventricular systolic dysfunction.  3. Left ventricular ejection fraction 38%  4. High-risk stress test findings*. Study is high risk based on low ejection fraction, there is no evidence of scar or active ischemia.   02/2014 Echo Study Conclusions  - Left ventricle: The cavity size was normal. Wall thickness was normal. The estimated ejection fraction was 55%. Normal global longitudinal strain -23%. Biplane LVEF 61% by speckle tracking. Wall motion was normal; there were no regional wall  motion abnormalities. Left ventricular diastolic function parameters were normal. - Mitral valve: Mildly thickened leaflets . There was trivial regurgitation. - Right atrium: Central venous pressure (est): 3 mm Hg. - Atrial septum: No defect or patent foramen ovale was identified. - Tricuspid valve: There was trivial regurgitation. - Pulmonary arteries: Systolic pressure could not be accurately estimated. - Pericardium, extracardiac: There was no pericardial effusion.  Impressions:  - Normal LV wall thickness with LVEF 55%. Normal global longitudinal strain -23%. Biplane LVEF 61% by speckle tracking. Grossly normal diastolic function. Trivial mitral and tricuspid regurgitation. Unable to assess PASP.    Assessment and Plan  1. CAD - no current symptoms, continue current meds   F/u 1 year  Antoine Poche, M.D.

## 2015-05-15 ENCOUNTER — Other Ambulatory Visit: Payer: Self-pay | Admitting: Cardiology

## 2016-05-25 ENCOUNTER — Other Ambulatory Visit: Payer: Self-pay | Admitting: Cardiology

## 2016-10-23 ENCOUNTER — Other Ambulatory Visit: Payer: Self-pay | Admitting: Cardiology

## 2017-06-20 NOTE — Congregational Nurse Program (Signed)
[name] stated  he has several teeth that needs extracting. Told that [facility], needs to be at the site  Between   5 and 6am Also stated that he had heart surgery on September 28,2018 and goes to the walk in clinic in [place] on 06-19-17.Advised to wait and talk to the MD at the clinic before having any dental work done. [name] RN, [place] [phone]

## 2017-09-04 ENCOUNTER — Other Ambulatory Visit: Payer: Self-pay | Admitting: Cardiology

## 2017-10-21 ENCOUNTER — Other Ambulatory Visit: Payer: Self-pay | Admitting: Cardiology

## 2017-10-29 ENCOUNTER — Other Ambulatory Visit: Payer: Self-pay | Admitting: Cardiology

## 2017-11-23 ENCOUNTER — Other Ambulatory Visit: Payer: Self-pay

## 2017-11-23 ENCOUNTER — Encounter (HOSPITAL_COMMUNITY): Payer: Self-pay | Admitting: Emergency Medicine

## 2017-11-23 ENCOUNTER — Emergency Department (HOSPITAL_COMMUNITY)
Admission: EM | Admit: 2017-11-23 | Discharge: 2017-11-23 | Disposition: A | Discharge status: Home / Self Care | Payer: Medicare Other | Attending: Emergency Medicine | Admitting: Emergency Medicine

## 2017-11-23 ENCOUNTER — Emergency Department (HOSPITAL_COMMUNITY): Payer: Medicare Other

## 2017-11-23 DIAGNOSIS — R55 Syncope and collapse: Secondary | ICD-10-CM | POA: Diagnosis not present

## 2017-11-23 DIAGNOSIS — R079 Chest pain, unspecified: Secondary | ICD-10-CM

## 2017-11-23 DIAGNOSIS — J449 Chronic obstructive pulmonary disease, unspecified: Secondary | ICD-10-CM | POA: Diagnosis not present

## 2017-11-23 DIAGNOSIS — Z951 Presence of aortocoronary bypass graft: Secondary | ICD-10-CM | POA: Diagnosis not present

## 2017-11-23 DIAGNOSIS — R51 Headache: Secondary | ICD-10-CM | POA: Insufficient documentation

## 2017-11-23 DIAGNOSIS — Z7901 Long term (current) use of anticoagulants: Secondary | ICD-10-CM | POA: Insufficient documentation

## 2017-11-23 DIAGNOSIS — F17210 Nicotine dependence, cigarettes, uncomplicated: Secondary | ICD-10-CM | POA: Insufficient documentation

## 2017-11-23 DIAGNOSIS — I2510 Atherosclerotic heart disease of native coronary artery without angina pectoris: Secondary | ICD-10-CM | POA: Insufficient documentation

## 2017-11-23 DIAGNOSIS — I10 Essential (primary) hypertension: Secondary | ICD-10-CM | POA: Diagnosis not present

## 2017-11-23 DIAGNOSIS — R519 Headache, unspecified: Secondary | ICD-10-CM

## 2017-11-23 LAB — I-STAT TROPONIN, ED: Troponin i, poc: 0 ng/mL (ref 0.00–0.08)

## 2017-11-23 LAB — D-DIMER, QUANTITATIVE: D-Dimer, Quant: 0.89 ug/mL-FEU — ABNORMAL HIGH (ref 0.00–0.50)

## 2017-11-23 LAB — BASIC METABOLIC PANEL
Anion gap: 6 (ref 5–15)
BUN: 18 mg/dL (ref 6–20)
CALCIUM: 9.1 mg/dL (ref 8.9–10.3)
CO2: 26 mmol/L (ref 22–32)
CREATININE: 0.89 mg/dL (ref 0.61–1.24)
Chloride: 108 mmol/L (ref 98–111)
GLUCOSE: 103 mg/dL — AB (ref 70–99)
Potassium: 3.9 mmol/L (ref 3.5–5.1)
Sodium: 140 mmol/L (ref 135–145)

## 2017-11-23 LAB — CBC
HCT: 43.2 % (ref 39.0–52.0)
Hemoglobin: 14.7 g/dL (ref 13.0–17.0)
MCH: 31.5 pg (ref 26.0–34.0)
MCHC: 34 g/dL (ref 30.0–36.0)
MCV: 92.7 fL (ref 78.0–100.0)
PLATELETS: 189 10*3/uL (ref 150–400)
RBC: 4.66 MIL/uL (ref 4.22–5.81)
RDW: 13.8 % (ref 11.5–15.5)
WBC: 7.3 10*3/uL (ref 4.0–10.5)

## 2017-11-23 LAB — TROPONIN I

## 2017-11-23 MED ORDER — IOPAMIDOL (ISOVUE-370) INJECTION 76%
100.0000 mL | Freq: Once | INTRAVENOUS | Status: AC | PRN
  Administered 2017-11-23: 100 mL via INTRAVENOUS

## 2017-11-23 MED ORDER — FAMOTIDINE IN NACL 20-0.9 MG/50ML-% IV SOLN
20.0000 mg | Freq: Once | INTRAVENOUS | Status: AC
  Administered 2017-11-23: 20 mg via INTRAVENOUS
  Filled 2017-11-23: qty 50

## 2017-11-23 MED ORDER — MORPHINE SULFATE (PF) 4 MG/ML IV SOLN
4.0000 mg | Freq: Once | INTRAVENOUS | Status: DC
  Filled 2017-11-23: qty 1

## 2017-11-23 MED ORDER — DIPHENHYDRAMINE HCL 50 MG/ML IJ SOLN
25.0000 mg | Freq: Once | INTRAMUSCULAR | Status: AC
  Administered 2017-11-23: 25 mg via INTRAVENOUS
  Filled 2017-11-23: qty 1

## 2017-11-23 MED ORDER — KETOROLAC TROMETHAMINE 30 MG/ML IJ SOLN
30.0000 mg | Freq: Once | INTRAMUSCULAR | Status: AC
  Administered 2017-11-23: 30 mg via INTRAVENOUS
  Filled 2017-11-23: qty 1

## 2017-11-23 MED ORDER — METHYLPREDNISOLONE SODIUM SUCC 125 MG IJ SOLR
125.0000 mg | Freq: Once | INTRAMUSCULAR | Status: AC
  Administered 2017-11-23: 125 mg via INTRAVENOUS
  Filled 2017-11-23: qty 2

## 2017-11-23 MED ORDER — FENTANYL CITRATE (PF) 100 MCG/2ML IJ SOLN
50.0000 ug | Freq: Once | INTRAMUSCULAR | Status: AC
  Administered 2017-11-23: 50 ug via INTRAVENOUS
  Filled 2017-11-23: qty 2

## 2017-11-23 NOTE — Discharge Instructions (Signed)
Tests showed no life-threatening condition.  Tylenol and/or ibuprofen for headache.  Follow-up with your primary care doctor.  Recommend repeat chest x-ray in 3 months to assess area in left lower lung.

## 2017-11-23 NOTE — ED Triage Notes (Signed)
Pt states chest pain since last night. Had possible seizure last night per family. Complains of sob with pain radiating to left neck and left arm.

## 2017-11-23 NOTE — ED Notes (Signed)
EDP at bedside  

## 2017-11-23 NOTE — ED Notes (Signed)
Pt returned from xray

## 2017-11-23 NOTE — ED Notes (Signed)
Pt returned from CT °

## 2017-11-23 NOTE — ED Notes (Signed)
Patient transported to X-ray 

## 2017-11-23 NOTE — ED Notes (Signed)
Pt c/o HA and is requesting medication. EDP notified.

## 2017-11-24 NOTE — ED Provider Notes (Addendum)
[facility] EMERGENCY DEPARTMENT Provider Note   CSN: [id number] Arrival date & time: 11/23/17  1504     History   Chief Complaint Chief Complaint  Patient presents with  . Chest Pain    HPI [name] is a 57 y.o. male.  Chest pain with radiation to the left neck and left arm earlier today.  Wife reports an episode last night at approximately 7:30 PM where his eyes rolled into the back of his head briefly and he responded to cold water being splashed in his face.  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  Additional medical problems include hypertension, COPD, cigarette smoking.  Questionable dyspnea and diaphoresis.  He is now feeling better.  Severity of symptoms is mild to moderate.  Nothing makes symptoms better or worse.  Review of systems positive for headache.     Past Medical History:  Diagnosis Date  . Arthritis   . Back injury    crushing  . CAD (coronary artery disease)   . COPD (chronic obstructive pulmonary disease) (HCC)   . Emphysema/COPD (HCC)   . H/O hernia repair   . H/O right coronary artery stent placement 2007  . History of stomach ulcers   . HLD (hyperlipidemia)   . Hypertension   . Myocardial infarction [facility]) 2007, 2009  . Pleurisy     Patient Active Problem List   Diagnosis Date Noted  . Spinal stenosis, lumbar 12/13/2014  . Headache(784.0) 09/04/2011  . HYPERLIPIDEMIA 12/09/2008  . TOBACCO ABUSE 12/09/2008  . CAD (coronary artery disease) 12/09/2008  . CRUSHING INJURY OF BACK 12/09/2008    Past Surgical History:  Procedure Laterality Date  . ANTERIOR LATERAL LUMBAR FUSION WITH PERCUTANEOUS SCREW 1 LEVEL Left 12/13/2014   Procedure:  EXTREME LUMBAR INTERBODY FUSION LUMBAR THREE-FOUR,LEFT SIDED APPROACH WITH UNILATERAL PEDICLE SCREWS LUMBAR THREE-FOUR LEFT;LATERAL PLATE;  Surgeon: Aliene Beams, MD;  Location: [facility];  Service: Neurosurgery;  Laterality: Left;  left  . APPENDECTOMY    . BACK SURGERY     two prior; spinal rod  .  COLONOSCOPY    . LUMBAR PERCUTANEOUS PEDICLE SCREW 1 LEVEL N/A 12/13/2014   Procedure: LUMBAR PERCUTANEOUS PEDICLE SCREW LUMBAR THREE-FOUR;  Surgeon: Aliene Beams, MD;  Location: [facility];  Service: Neurosurgery;  Laterality: N/A;  . TONSILLECTOMY    . TOOTH EXTRACTION          Home Medications    Prior to Admission medications   Medication Sig Start Date End Date Taking? Authorizing Provider  albuterol (PROVENTIL HFA;VENTOLIN HFA) 108 (90 BASE) MCG/ACT inhaler Inhale 1-2 puffs into the lungs every 6 (six) hours as needed for wheezing or shortness of breath.   Yes [provider]  aspirin 81 MG tablet Take 81 mg by mouth daily.   Yes [provider]  atorvastatin (LIPITOR) 80 MG tablet Take 1 tablet (80 mg total) by mouth daily. 01/31/14  Yes Branch, Dorothe Pea, MD  baclofen (LIORESAL) 20 MG tablet Take 20 mg by mouth every 8 (eight) hours. 10/24/17  Yes [provider]  clopidogrel (PLAVIX) 75 MG tablet Take 75 mg by mouth daily. 09/15/17  Yes [provider]  gabapentin (NEURONTIN) 600 MG tablet Take 900 mg by mouth 4 (four) times daily.  09/03/14  Yes [provider]  nitroGLYCERIN (NITROSTAT) 0.4 MG SL tablet Place 1 tablet (0.4 mg total) under the tongue every 5 (five) minutes x 3 doses as needed for chest pain. If no relief after 3 rd dose,  proceed to the ED for an evalutaion. 02/14/15  Yes BranchDorothe Pea, MD  traZODone (DESYREL) 50 MG tablet Take 50 mg by mouth at bedtime as needed for sleep.  09/23/17  Yes [provider]    Family History Family History  Problem Relation Age of Onset  . Coronary artery disease Unknown     Social History Social History   Tobacco Use  . Smoking status: Current Some Day Smoker    Packs/day: 0.50    Years: 40.00    Pack years: 20.00    Types: Cigarettes    Start date: 03/15/1974  . Smokeless tobacco: Never Used  . Tobacco comment: maybe 5 per week   Substance Use Topics  . Alcohol  use: No    Alcohol/week: 0.0 oz  . Drug use: No     Allergies   Apple and Peppermint flavor   Review of Systems Review of Systems  All other systems reviewed and are negative.    Physical Exam Updated Vital Signs BP 133/83   Pulse (!) 53   Temp 97.8 F (36.6 C) (Oral)   Resp 20   Ht 6' (1.829 m)   Wt 68 kg (150 lb)   SpO2 97%   BMI 20.34 kg/m   Physical Exam  Constitutional: He is oriented to person, place, and time. He appears well-developed and well-nourished.  HENT:  Head: Normocephalic and atraumatic.  Eyes: Conjunctivae are normal.  Neck: Neck supple.  Cardiovascular: Normal rate and regular rhythm.  Pulmonary/Chest: Effort normal and breath sounds normal.  Abdominal: Soft. Bowel sounds are normal.  Musculoskeletal: Normal range of motion.  Neurological: He is alert and oriented to person, place, and time.  Skin: Skin is warm and dry.  Psychiatric: He has a normal mood and affect. His behavior is normal.  Nursing note and vitals reviewed.    ED Treatments / Results  Labs (all labs ordered are listed, but only abnormal results are displayed) Labs Reviewed  BASIC METABOLIC PANEL - Abnormal; Notable for the following components:      Result Value   Glucose, Bld 103 (*)    All other components within normal limits  D-DIMER, QUANTITATIVE (NOT AT [facility]) - Abnormal; Notable for the following components:   D-Dimer, Quant 0.89 (*)    All other components within normal limits  CBC  TROPONIN I  I-STAT TROPONIN, ED    EKG EKG Interpretation  Date/Time:  Sunday November 23 2017 15:09:25 EDT Ventricular Rate:  77 PR Interval:    QRS Duration: 93 QT Interval:  417 QTC Calculation: 472 R Axis:   92 Text Interpretation:  Sinus rhythm Borderline right axis deviation Confirmed by Donnetta Hutching ([id number]) on 11/23/2017 6:14:25 PM   Radiology Dg Chest 2 View  Result Date: 11/23/2017 CLINICAL DATA:  Chest pain EXAM: CHEST - 2 VIEW COMPARISON:  04/17/2017 chest  radiograph. FINDINGS: Intact sternotomy wires. CABG clips overlie the mediastinum. Stable cardiomediastinal silhouette with normal heart size. No pneumothorax. No pleural effusion. Hyperinflated lungs. No pulmonary edema. No acute consolidative airspace disease. IMPRESSION: 1. No acute cardiopulmonary disease. 2. Hyperinflated lungs, suggesting COPD. Electronically Signed   By: Delbert Phenix M.D.   On: 11/23/2017 15:49   Ct Angio Chest Pe W And/or Wo Contrast  Result Date: 11/23/2017 CLINICAL DATA:  Central chest pain onset last night, shortness of breath, pain radiating to LEFT neck and LEFT arm, headache. Possible seizure last night. History of coronary artery disease, COPD, hypertension, emphysema, MI, coronary stent. EXAM:  CT ANGIOGRAPHY CHEST WITH CONTRAST TECHNIQUE: Multidetector CT imaging of the chest was performed using the standard protocol during bolus administration of intravenous contrast. Multiplanar CT image reconstructions and MIPs were obtained to evaluate the vascular anatomy. CONTRAST:  100mL ISOVUE-370 IOPAMIDOL (ISOVUE-370) INJECTION 76% COMPARISON:  None. FINDINGS: Cardiovascular: There is no pulmonary embolism identified within the main, lobar or segmental pulmonary arteries bilaterally. Heart size is normal. No thoracic aortic aneurysm or evidence of aortic dissection. Scattered aortic atherosclerosis. Coronary artery stents in place. Median sternotomy for presumed CABG. Mediastinum/Nodes: No mass or enlarged lymph nodes seen within the mediastinum or perihilar regions. Esophagus is unremarkable. Trachea and central bronchi are unremarkable. Lungs/Pleura: Bilateral emphysematous changes, moderate in degree, upper lobe predominant. Small ill-defined consolidation within the LEFT lower lobe, with overlying endobronchial debris suggesting atelectasis. No pleural effusion or pneumothorax. Upper Abdomen: No acute abnormality. Musculoskeletal: Mild degenerative spurring within the thoracic spine.  No acute or suspicious osseous finding. Review of the MIP images confirms the above findings. IMPRESSION: 1. No pulmonary embolism. 2. Small ill-defined consolidation within the LEFT lower lobe, with overlying endobronchial debris suggesting atelectasis, less likely pneumonia. Additional central bronchitic changes bilaterally, of uncertain chronicity. 3. Emphysema, moderate in degree, upper lobe predominant. 4. Mild aortic atherosclerosis. Aortic Atherosclerosis (ICD10-I70.0) and Emphysema (ICD10-J43.9). Electronically Signed   By: Bary RichardStan  Maynard M.D.   On: 11/23/2017 18:24    Procedures Procedures (including critical care time)  Medications Ordered in ED Medications  ketorolac (TORADOL) 30 MG/ML injection 30 mg (30 mg Intravenous Given 11/23/17 1743)  iopamidol (ISOVUE-370) 76 % injection 100 mL (100 mLs Intravenous Contrast Given 11/23/17 1805)  fentaNYL (SUBLIMAZE) injection 50 mcg (50 mcg Intravenous Given 11/23/17 2007)  methylPREDNISolone sodium succinate (SOLU-MEDROL) 125 mg/2 mL injection 125 mg (125 mg Intravenous Given 11/23/17 2313)  famotidine (PEPCID) IVPB 20 mg premix (0 mg Intravenous Stopped 11/23/17 2346)  diphenhydrAMINE (BENADRYL) injection 25 mg (25 mg Intravenous Given 11/23/17 2314)     Initial Impression / Assessment and Plan / ED Course  I have reviewed the triage vital signs and the nursing notes.  Pertinent labs & imaging results that were available during my care of the patient were reviewed by me and considered in my medical decision making (see chart for details).     Patient with known CAD and status post CABG in September 2018 presents with chest pain.  Work-up including EKG, chest x-ray, troponin all negative.  CT angiogram reveals no pulmonary embolism and ill-defined consolidation in left lower lobe.  Patient was observed for several hours in the emergency department.  He was hemodynamically stable at discharge.  Test results were discussed with the patient.  He will  follow-up with his primary care doctor.  CT head was offered to the patient to evaluate his headache.  This was declined. Final Clinical Impressions(s) / ED Diagnoses   Final diagnoses:  Chest pain, unspecified type  Syncope, unspecified syncope type  Intractable headache, unspecified chronicity pattern, unspecified headache type    ED Discharge Orders    None       Donnetta Hutchingook, Tomeshia Pizzi, MD 11/24/17 1647    Donnetta Hutchingook, Shakala Marlatt, MD 11/24/17 [phone]

## 2019-10-22 ENCOUNTER — Ambulatory Visit: Payer: Medicare Other | Admitting: Urology

## 2019-10-22 NOTE — Progress Notes (Deleted)
Subjective: No diagnosis found.   [name] is a 59 yo WM who is sent in consultation by Dr. Bartolo Darter for penile pain and Peyronie's disease.  ROS:  ROS  Allergies  Allergen Reactions   Apple Anaphylaxis    Throat swelling.    Peppermint Flavor Hives and Itching    Past Medical History:  Diagnosis Date   Arthritis    Back injury    crushing   CAD (coronary artery disease)    COPD (chronic obstructive pulmonary disease) (HCC)    Emphysema/COPD (HCC)    H/O hernia repair    H/O right coronary artery stent placement 2007   History of stomach ulcers    HLD (hyperlipidemia)    Hypertension    Myocardial infarction ([place]) 2007, 2009   Pleurisy     Past Surgical History:  Procedure Laterality Date   ANTERIOR LATERAL LUMBAR FUSION WITH PERCUTANEOUS SCREW 1 LEVEL Left 12/13/2014   Procedure:  EXTREME LUMBAR INTERBODY FUSION LUMBAR THREE-FOUR,LEFT SIDED APPROACH WITH UNILATERAL PEDICLE SCREWS LUMBAR THREE-FOUR LEFT;LATERAL PLATE;  Surgeon: Karie Chimera, MD;  Location: [facility];  Service: Neurosurgery;  Laterality: Left;  left   APPENDECTOMY     BACK SURGERY     two prior; spinal rod   COLONOSCOPY     LUMBAR PERCUTANEOUS PEDICLE SCREW 1 LEVEL N/A 12/13/2014   Procedure: LUMBAR PERCUTANEOUS PEDICLE SCREW LUMBAR THREE-FOUR;  Surgeon: Karie Chimera, MD;  Location: [place] [facility];  Service: Neurosurgery;  Laterality: N/A;   TONSILLECTOMY     TOOTH EXTRACTION      Social History   Socioeconomic History   Marital status: Married    Spouse name: Not on file   Number of children: Not on file   Years of education: Not on file   Highest education level: Not on file  Occupational History   Not on file  Tobacco Use   Smoking status: Current Some Day Smoker    Packs/day: 0.50    Years: 40.00    Pack years: 20.00    Types: Cigarettes    Start date: 03/15/1974   Smokeless tobacco: Never Used   Tobacco comment: maybe 5 per week   Substance and Sexual  Activity   Alcohol use: No    Alcohol/week: 0.0 standard drinks   Drug use: No   Sexual activity: Yes    Partners: Female  Other Topics Concern   Not on file  Social History Narrative   Not on file   Social Determinants of Health   Financial Resource Strain:    Difficulty of Paying Living Expenses:   Food Insecurity:    Worried About Charity fundraiser in the Last Year:    Arboriculturist in the Last Year:   Transportation Needs:    Film/video editor (Medical):    Lack of Transportation (Non-Medical):   Physical Activity:    Days of Exercise per Week:    Minutes of Exercise per Session:   Stress:    Feeling of Stress :   Social Connections:    Frequency of Communication with Friends and Family:    Frequency of Social Gatherings with Friends and Family:    Attends Religious Services:    Active Member of Clubs or Organizations:    Attends Archivist Meetings:    Marital Status:   Intimate Partner Violence:    Fear of Current or Ex-Partner:    Emotionally Abused:    Physically Abused:    Sexually Abused:  Family History  Problem Relation Age of Onset   Coronary artery disease Unknown     Anti-infectives: Anti-infectives (From admission, onward)   None      Current Outpatient Medications  Medication Sig Dispense Refill   albuterol (PROVENTIL HFA;VENTOLIN HFA) 108 (90 BASE) MCG/ACT inhaler Inhale 1-2 puffs into the lungs every 6 (six) hours as needed for wheezing or shortness of breath.     aspirin 81 MG tablet Take 81 mg by mouth daily.     atorvastatin (LIPITOR) 80 MG tablet Take 1 tablet (80 mg total) by mouth daily. 30 tablet 6   baclofen (LIORESAL) 20 MG tablet Take 20 mg by mouth every 8 (eight) hours.  0   clopidogrel (PLAVIX) 75 MG tablet Take 75 mg by mouth daily.     gabapentin (NEURONTIN) 600 MG tablet Take 900 mg by mouth 4 (four) times daily.      nitroGLYCERIN (NITROSTAT) 0.4 MG SL tablet Place 1  tablet (0.4 mg total) under the tongue every 5 (five) minutes x 3 doses as needed for chest pain. If no relief after 3 rd dose, proceed to the ED for an evalutaion. 25 tablet 0   traZODone (DESYREL) 50 MG tablet Take 50 mg by mouth at bedtime as needed for sleep.      No current facility-administered medications for this visit.     Objective: Vital signs in last 24 hours: There were no vitals taken for this visit.  Intake/Output from previous day: No intake/output data recorded. Intake/Output this shift: @IOTHISSHIFT @   Physical Exam  Lab Results:  No results found for this or any previous visit (from the past 24 hour(s)).  BMET No results for input(s): NA, K, CL, CO2, GLUCOSE, BUN, CREATININE, CALCIUM in the last 72 hours. PT/INR No results for input(s): LABPROT, INR in the last 72 hours. ABG No results for input(s): PHART, HCO3 in the last 72 hours.  Invalid input(s): PCO2, PO2  Studies/Results: No results found.   Assessment/Plan: No problem-specific Assessment & Plan notes found for this encounter.   No orders of the defined types were placed in this encounter.    No orders of the defined types were placed in this encounter.    No follow-ups on file.    CC: ***     10/22/2019 [phone]

## 2019-11-18 ENCOUNTER — Ambulatory Visit: Payer: Medicare HMO | Admitting: Urology

## 2019-11-19 ENCOUNTER — Ambulatory Visit: Payer: Medicare HMO | Admitting: Urology

## 2019-11-24 ENCOUNTER — Other Ambulatory Visit: Payer: Self-pay

## 2019-11-24 ENCOUNTER — Encounter: Payer: Self-pay | Admitting: Urology

## 2019-11-24 ENCOUNTER — Ambulatory Visit (INDEPENDENT_AMBULATORY_CARE_PROVIDER_SITE_OTHER): Payer: Medicare HMO | Admitting: Urology

## 2019-11-24 VITALS — BP 117/79 | HR 71 | Temp 98.1°F | Ht 72.0 in | Wt 158.0 lb

## 2019-11-24 DIAGNOSIS — N486 Induration penis plastica: Secondary | ICD-10-CM

## 2019-11-24 MED ORDER — PENTOXIFYLLINE ER 400 MG PO TBCR: 400.0000 mg | EXTENDED_RELEASE_TABLET | Freq: Two times a day (BID) | ORAL | 2 refills | Status: DC

## 2019-11-24 NOTE — Progress Notes (Signed)

## 2019-11-24 NOTE — Patient Instructions (Signed)
Collagenase injection (Dupuytren's Contracture/Peyronie's Disease) What is this medicine? COLLAGENASE (kohl LAH jen ace) is used to treat Dupuytren's contracture. This medicine may help straighten a bent finger by breaking up hard tissue. It is also used for Peyronie's disease by breaking up the hard tissue plaque that causes the curvature in the penis. This medicine may be used for other purposes; ask your health care provider or pharmacist if you have questions. COMMON BRAND NAME(S): Xiaflex What should I tell my health care provider before I take this medicine? They need to know if you have any of these conditions:  hemophilia  low platelet counts  take medicines that treat or prevent blood clots  an unusual or allergic reaction to collagenase, other medicines, foods, dyes, or preservatives  pregnant or trying to get pregnant  breast-feeding How should I use this medicine? This medicine is for injection into the hand or penis. It is given by a health care professional in a hospital or clinic setting. A special MedGuide will be given to you by the pharmacist with each prescription and refill. Be sure to read this information carefully each time. Talk to your pediatrician regarding the use of this medicine in children. Special care may be needed. Overdosage: If you think you have taken too much of this medicine contact a poison control center or emergency room at once. NOTE: This medicine is only for you. Do not share this medicine with others. What if I miss a dose? It is important not to miss your dose. Call your doctor or health care professional if you are unable to keep an appointment. What may interact with this medicine?  aspirin and aspirin-like medicines  certain medicines that treat or prevent blood clots like warfarin, enoxaparin, and dalteparin This list may not describe all possible interactions. Give your health care provider a list of all the medicines, herbs,  non-prescription drugs, or dietary supplements you use. Also tell them if you smoke, drink alcohol, or use illegal drugs. Some items may interact with your medicine. What should I watch for while using this medicine? Your condition will be monitored carefully while you are receiving this medicine. If being treated for Dupuytren's contracture, return to your healthcare provider the day after your hand is injected. In the meantime, do not flex or extend the fingers of your hand that was injected. Do not touch your finger that was injected, and elevate your hand until bedtime. Do not perform activity with the injected hand until you are told that it is OK. Follow any instructions about wearing a splint or performing finger exercises. Also, call your healthcare provider if you get increasing redness or swelling in the hand, if you have numbness or tingling in the treated finger, or if you have trouble bending the finger after the swelling goes down. If being treated for Peyronie's disease, you will need to return to your healthcare provider for a manual procedure that will stretch and help straighten your penis. Also, your healthcare provider will show you how to gently stretch your penis at home. Do not resume sexual activity until you are told that it is okay. Follow instructions on when to return for follow-up visits. Immediately call your doctor if you have trouble stretching or straightening your penis, or if you have pain or other concerns. Immediately call your healthcare provider if you get a fever or chills. What side effects may I notice from receiving this medicine? Side effects that you should report to your doctor or health   care professional as soon as possible:  allergic reactions like skin rash, itching or hives, swelling of the face, lips, or tongue  breathing problems  chest pain or palpitations  pain in your penis  pain when urinating  red or dark-brown urine  sudden loss of the  ability to maintain an erection  swelling of the injected hand  unusual swelling or bruising of the penis Side effects that usually do not require medical attention (report to your doctor or health care professional if they continue or are bothersome):  irritation at site where injected  pain at site where injected  unusual bleeding or bruising This list may not describe all possible side effects. Call your doctor for medical advice about side effects. You may report side effects to FDA at 1-800-FDA-1088. Where should I keep my medicine? This drug is given in a hospital or clinic and will not be stored at home. NOTE: This sheet is a summary. It may not cover all possible information. If you have questions about this medicine, talk to your doctor, pharmacist, or health care provider.  2020 Elsevier/Gold Standard (2015-06-08 09:34:13)  

## 2019-11-24 NOTE — Progress Notes (Signed)
11/24/2019 10:50 AM   [name] [birth year] [id number]  Referring provider: Smith Robert, MD [address] [place],  [place] [address]  Penile curvature  HPI: [name] is a 58yo here for evaluation of penile curvature. Starting 5 months ago he noted left lateral curvature at the base. He has pain with erections for the past 5 months. It has not changed in curvature in 3 months. He issues getting and maintaining an erection. He has noticed a knot in his penis for over 3 months. NO previous trauma. No issues urinating.    PMH: Past Medical History:  Diagnosis Date  . Arthritis   . Back injury    crushing  . CAD (coronary artery disease)   . COPD (chronic obstructive pulmonary disease) (HCC)   . Emphysema/COPD (HCC)   . H/O hernia repair   . H/O right coronary artery stent placement 2007  . History of stomach ulcers   . HLD (hyperlipidemia)   . Hypertension   . Myocardial infarction [facility]) 2007, 2009  . Pleurisy     Surgical History: Past Surgical History:  Procedure Laterality Date  . ANTERIOR LATERAL LUMBAR FUSION WITH PERCUTANEOUS SCREW 1 LEVEL Left 12/13/2014   Procedure:  EXTREME LUMBAR INTERBODY FUSION LUMBAR THREE-FOUR,LEFT SIDED APPROACH WITH UNILATERAL PEDICLE SCREWS LUMBAR THREE-FOUR LEFT;LATERAL PLATE;  Surgeon: Aliene Beams, MD;  Location: [facility];  Service: Neurosurgery;  Laterality: Left;  left  . APPENDECTOMY    . BACK SURGERY     two prior; spinal rod  . COLONOSCOPY    . LUMBAR PERCUTANEOUS PEDICLE SCREW 1 LEVEL N/A 12/13/2014   Procedure: LUMBAR PERCUTANEOUS PEDICLE SCREW LUMBAR THREE-FOUR;  Surgeon: Aliene Beams, MD;  Location: [facility];  Service: Neurosurgery;  Laterality: N/A;  . TONSILLECTOMY    . TOOTH EXTRACTION      Home Medications:  Allergies as of 11/24/2019      Reactions   Apple Anaphylaxis   Throat swelling.    Peppermint Flavor Hives, Itching      Medication List       Accurate as of November 24, 2019 10:50 AM.  If you have any questions, ask your nurse or doctor.        albuterol 108 (90 Base) MCG/ACT inhaler Commonly known as: VENTOLIN HFA Inhale 1-2 puffs into the lungs every 6 (six) hours as needed for wheezing or shortness of breath.   aspirin 81 MG tablet Take 81 mg by mouth daily.   atorvastatin 80 MG tablet Commonly known as: LIPITOR Take 1 tablet (80 mg total) by mouth daily.   baclofen 20 MG tablet Commonly known as: LIORESAL Take 20 mg by mouth every 8 (eight) hours.   clopidogrel 75 MG tablet Commonly known as: PLAVIX Take 75 mg by mouth daily.   gabapentin 600 MG tablet Commonly known as: NEURONTIN Take 900 mg by mouth 4 (four) times daily.   gabapentin 300 MG capsule Commonly known as: NEURONTIN   metoprolol tartrate 50 MG tablet Commonly known as: LOPRESSOR   nitroGLYCERIN 0.4 MG SL tablet Commonly known as: Nitrostat Place 1 tablet (0.4 mg total) under the tongue every 5 (five) minutes x 3 doses as needed for chest pain. If no relief after 3 rd dose, proceed to the ED for an evalutaion.   traMADol 50 MG tablet Commonly known as: ULTRAM   traZODone 50 MG tablet Commonly known as: DESYREL Take 50 mg by mouth at bedtime as needed for sleep.  Allergies:  Allergies  Allergen Reactions  . Apple Anaphylaxis    Throat swelling.   Marland Kitchen Peppermint Flavor Hives and Itching    Family History: Family History  Problem Relation Age of Onset  . Coronary artery disease Unknown     Social History:  reports that he has been smoking cigarettes. He started smoking about 45 years ago. He has a 20.00 pack-year smoking history. He has never used smokeless tobacco. He reports that he does not drink alcohol and does not use drugs.  ROS: All other review of systems were reviewed and are negative except what is noted above in HPI  Physical Exam: BP 117/79   Pulse 71   Temp 98.1 F (36.7 C)   Ht 6' (1.829 m)   Wt 158 lb (71.7 kg)   BMI 21.43 kg/m     Constitutional:  Alert and oriented, No acute distress. HEENT: [place] AT, moist mucus membranes.  Trachea midline, no masses. Cardiovascular: No clubbing, cyanosis, or edema. Respiratory: Normal respiratory effort, no increased work of breathing. GI: Abdomen is soft, nontender, nondistended, no abdominal masses GU: No CVA tenderness. uncircumcised phallus. No masses/lesions on penis, testis, scrotum. He has 1cm right base plaque and a dorsal left base plaque.  Lymph: No cervical or inguinal lymphadenopathy. Skin: No rashes, bruises or suspicious lesions. Neurologic: Grossly intact, no focal deficits, moving all 4 extremities. Psychiatric: Normal mood and affect.  Laboratory Data: Lab Results  Component Value Date   WBC 7.3 11/23/2017   HGB 14.7 11/23/2017   HCT 43.2 11/23/2017   MCV 92.7 11/23/2017   PLT 189 11/23/2017    Lab Results  Component Value Date   CREATININE 0.89 11/23/2017    No results found for: PSA  No results found for: TESTOSTERONE  No results found for: HGBA1C  Urinalysis No results found for: COLORURINE, APPEARANCEUR, LABSPEC, PHURINE, GLUCOSEU, HGBUR, BILIRUBINUR, KETONESUR, PROTEINUR, UROBILINOGEN, NITRITE, LEUKOCYTESUR  No results found for: LABMICR, WBCUA, RBCUA, LABEPIT, MUCUS, BACTERIA  Pertinent Imaging:  No results found for this or any previous visit.  No results found for this or any previous visit.  No results found for this or any previous visit.  No results found for this or any previous visit.  No results found for this or any previous visit.  No results found for this or any previous visit.  No results found for this or any previous visit.  No results found for this or any previous visit.   Assessment & Plan:    1. Peyronie's disease I discussed the natural hx of peyronies disease and the various treatment options including penile plication, verapamil, and xiaflex therapy. We will start him on trental 400mg  BID for 3 months.  RTC 3 months with pictures of his erection  No follow-ups on file.  , MD  [facility] [place]

## 2019-12-08 ENCOUNTER — Ambulatory Visit: Payer: Medicare HMO | Admitting: Urology

## 2020-01-24 IMAGING — CT CT ANGIO CHEST
2 of 6 series · 18 of 46 positions shown · IV contrast (Isovue)
Comparison: None.

CLINICAL DATA: Central chest pain onset last night, shortness of
breath, pain radiating to LEFT neck and LEFT arm, headache. Possible
seizure last night. History of coronary artery disease, COPD,
hypertension, emphysema, MI, coronary stent.

EXAM:
CT ANGIOGRAPHY CHEST WITH CONTRAST
TECHNIQUE: Multidetector CT imaging of the chest was performed using the
standard protocol during bolus administration of intravenous
contrast. Multiplanar CT image reconstructions and MIPs were
obtained to evaluate the vascular anatomy.
CONTRAST:  100mL BEZSUF-YQX IOPAMIDOL (BEZSUF-YQX) INJECTION 76%

[Series 5: thins · axial · 0.75mm/px · z∈[+1173,+1495]mm · 15 of 354 slices shown]
[im 16/354  lung]
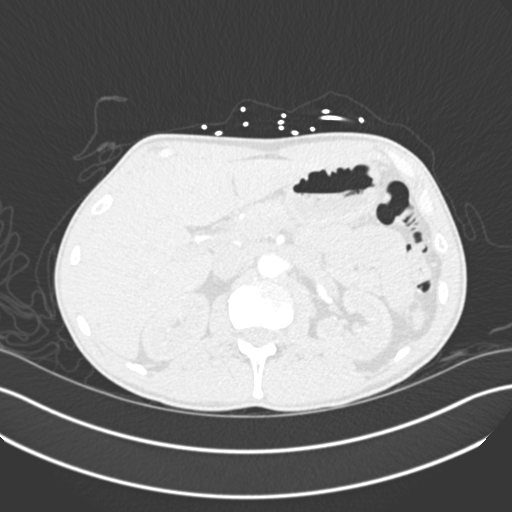
[im 47/354  soft-tissue]
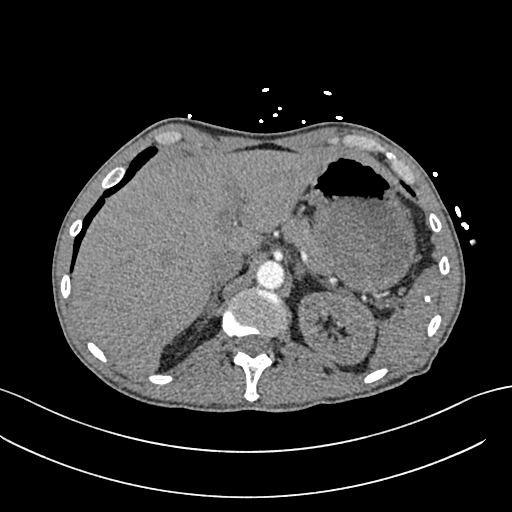
[im 62/354  lung]
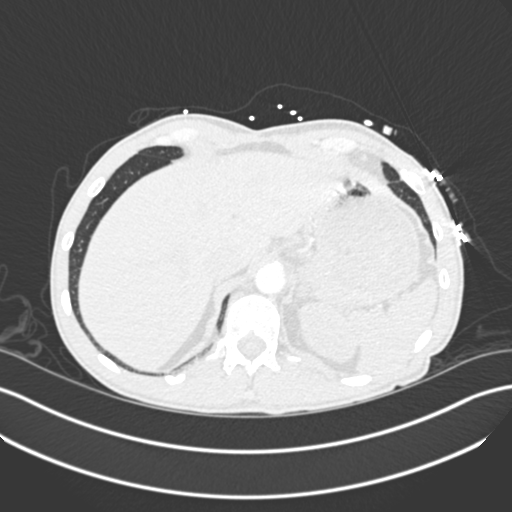
[im 93/354  soft-tissue]
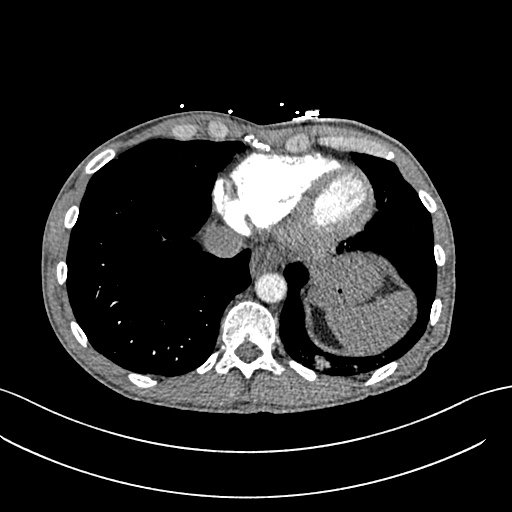
[im 108/354  lung]
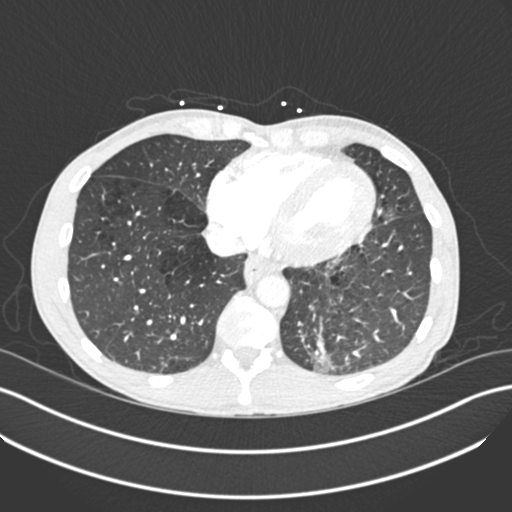
[im 139/354  soft-tissue]
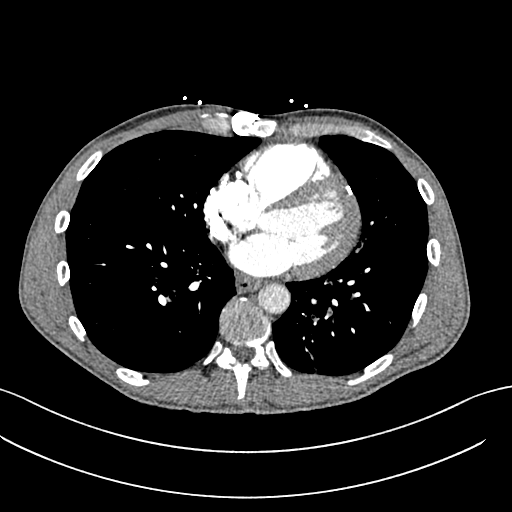
[im 154/354  lung]
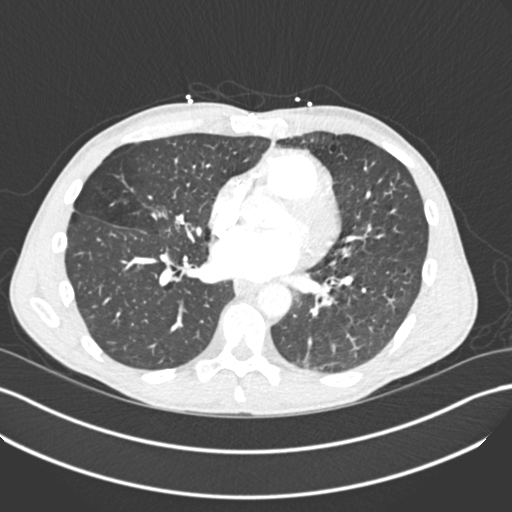
[im 185/354  soft-tissue]
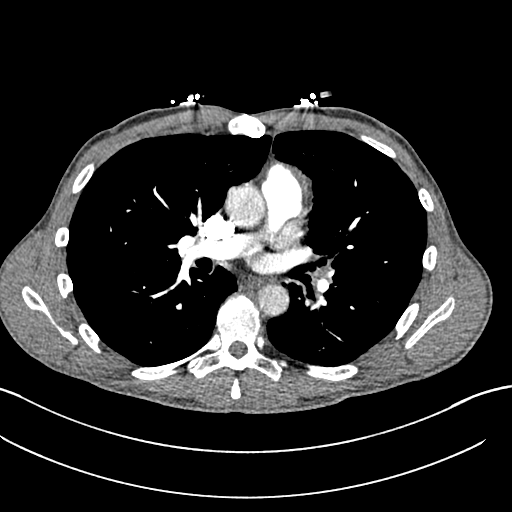
[im 200/354  lung]
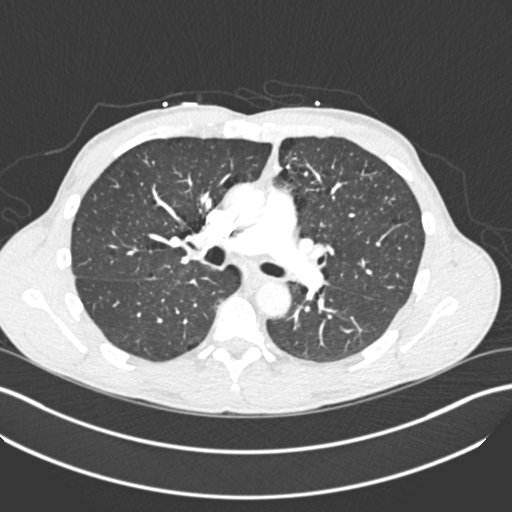
[im 215/354  soft-tissue]
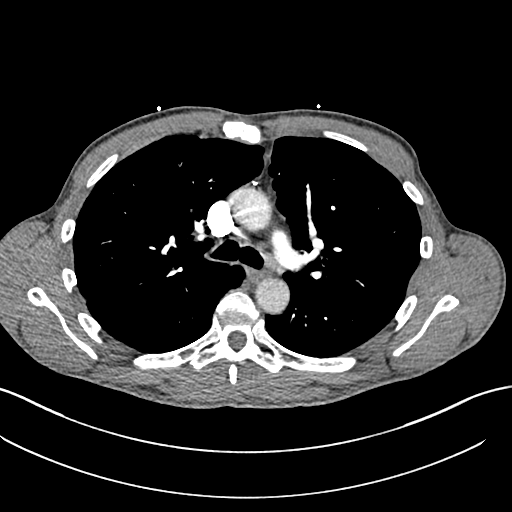
[im 246/354  lung]
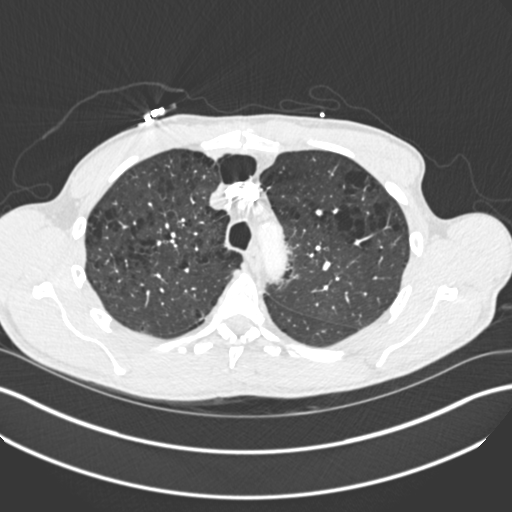
[im 261/354  soft-tissue]
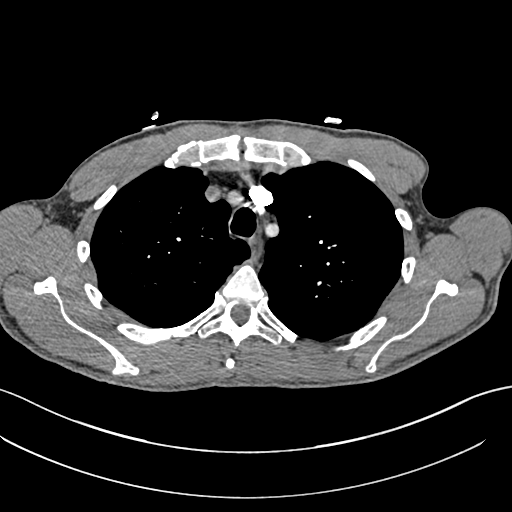
[im 292/354  lung]
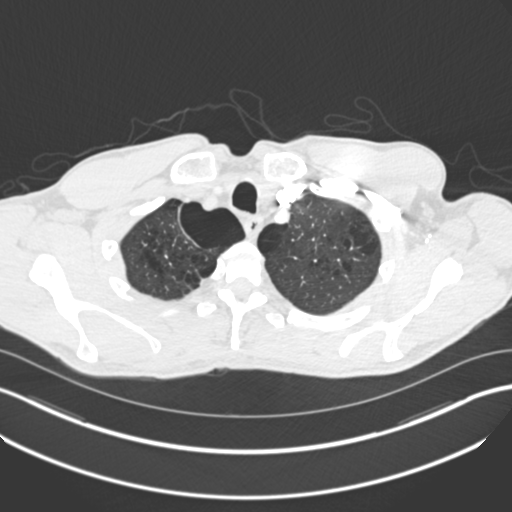
[im 307/354  soft-tissue]
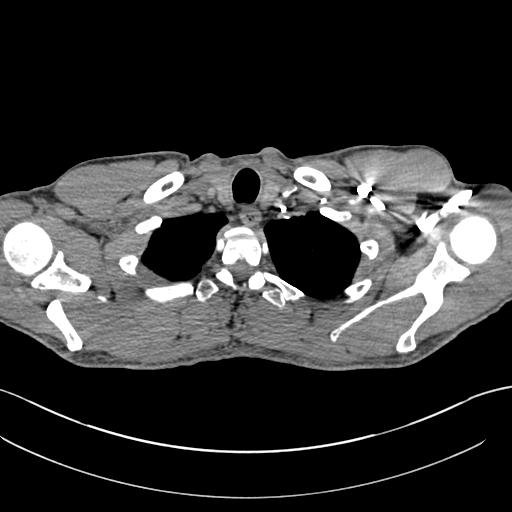
[im 338/354  lung]
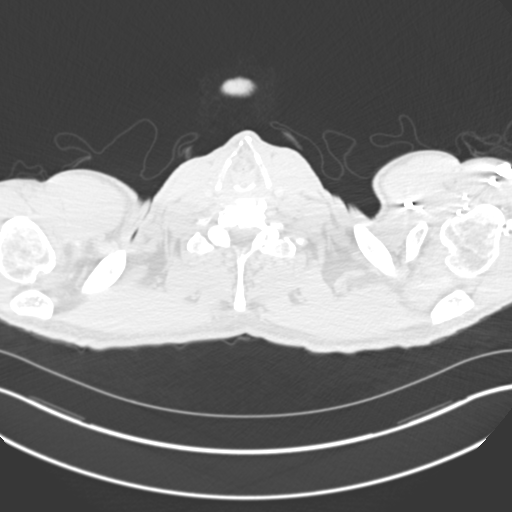

[Series 7: coronal mpr · coronal · 0.68mm/px · 3 of 120 slices shown]
[im 30/120  soft-tissue]
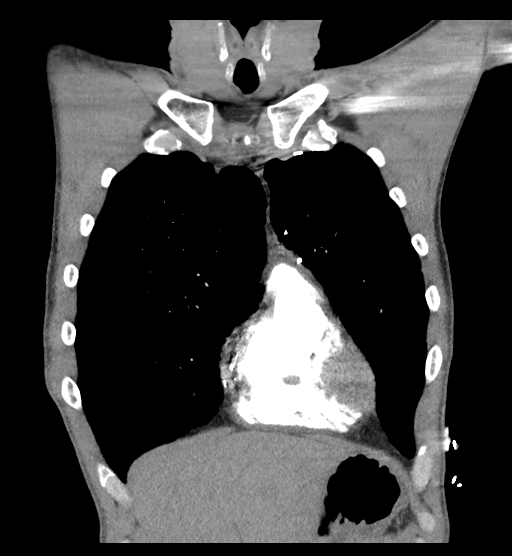
[im 60/120  soft-tissue]
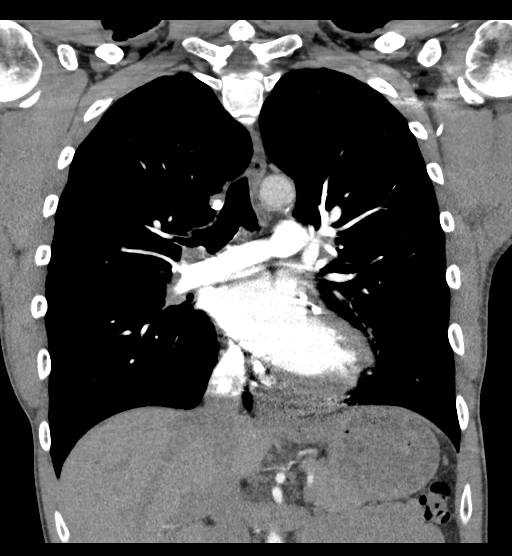
[im 90/120  soft-tissue]
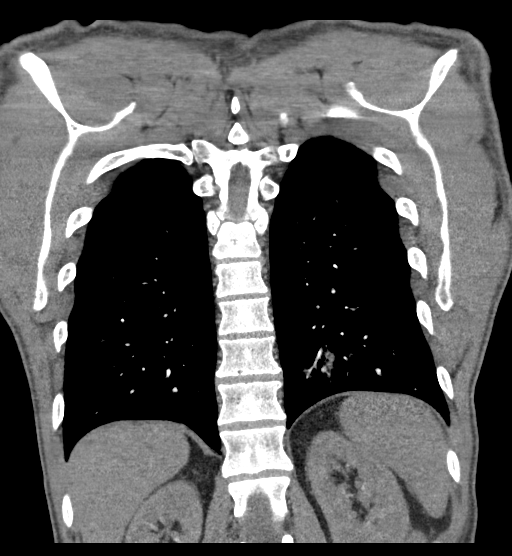

[18 of 46 positions shown; findings below may reference images not displayed]

FINDINGS: Cardiovascular: There is no pulmonary embolism identified within the
main, lobar or segmental pulmonary arteries bilaterally.

Heart size is normal. No thoracic aortic aneurysm or evidence of
aortic dissection. Scattered aortic atherosclerosis. Coronary artery
stents in place. Median sternotomy for presumed CABG.

Mediastinum/Nodes: No mass or enlarged lymph nodes seen within the
mediastinum or perihilar regions. Esophagus is unremarkable. Trachea
and central bronchi are unremarkable.

Lungs/Pleura: Bilateral emphysematous changes, moderate in degree,
upper lobe predominant. Small ill-defined consolidation within the
LEFT lower lobe, with overlying endobronchial debris suggesting
atelectasis. No pleural effusion or pneumothorax.

Upper Abdomen: No acute abnormality.

Musculoskeletal: Mild degenerative spurring within the thoracic
spine. No acute or suspicious osseous finding.

Review of the MIP images confirms the above findings.
IMPRESSION: 1. No pulmonary embolism.
2. Small ill-defined consolidation within the LEFT lower lobe, with
overlying endobronchial debris suggesting atelectasis, less likely
pneumonia. Additional central bronchitic changes bilaterally, of
uncertain chronicity.
3. Emphysema, moderate in degree, upper lobe predominant.
4. Mild aortic atherosclerosis.

Aortic Atherosclerosis (4UMP4-XG7.7) and Emphysema (4UMP4-3P9.K).

## 2020-02-06 ENCOUNTER — Other Ambulatory Visit: Payer: Self-pay | Admitting: Urology

## 2020-02-24 ENCOUNTER — Ambulatory Visit (INDEPENDENT_AMBULATORY_CARE_PROVIDER_SITE_OTHER): Payer: Medicare HMO | Admitting: Urology

## 2020-02-24 ENCOUNTER — Other Ambulatory Visit: Payer: Self-pay

## 2020-02-24 ENCOUNTER — Encounter: Payer: Self-pay | Admitting: Urology

## 2020-02-24 VITALS — BP 108/67 | HR 58 | Temp 97.7°F | Ht 72.0 in | Wt 158.0 lb

## 2020-02-24 DIAGNOSIS — N486 Induration penis plastica: Secondary | ICD-10-CM | POA: Diagnosis not present

## 2020-02-24 LAB — URINALYSIS, ROUTINE W REFLEX MICROSCOPIC
Bilirubin, UA: NEGATIVE
Glucose, UA: NEGATIVE
Leukocytes,UA: NEGATIVE
Nitrite, UA: NEGATIVE
RBC, UA: NEGATIVE
Specific Gravity, UA: 1.025 (ref 1.005–1.030)
Urobilinogen, Ur: 0.2 mg/dL (ref 0.2–1.0)
pH, UA: 6 (ref 5.0–7.5)

## 2020-02-24 LAB — MICROSCOPIC EXAMINATION
Bacteria, UA: NONE SEEN
RBC: NONE SEEN /hpf (ref 0–2)
Renal Epithel, UA: NONE SEEN /hpf

## 2020-02-24 NOTE — Patient Instructions (Signed)
Collagenase injection (Dupuytren's Contracture/Peyronie's Disease) What is this medicine? COLLAGENASE (kohl LAH jen ace) is used to treat Dupuytren's contracture. This medicine may help straighten a bent finger by breaking up hard tissue. It is also used for Peyronie's disease by breaking up the hard tissue plaque that causes the curvature in the penis. This medicine may be used for other purposes; ask your health care provider or pharmacist if you have questions. COMMON BRAND NAME(S): Xiaflex What should I tell my health care provider before I take this medicine? They need to know if you have any of these conditions:  hemophilia  low platelet counts  take medicines that treat or prevent blood clots  an unusual or allergic reaction to collagenase, other medicines, foods, dyes, or preservatives  pregnant or trying to get pregnant  breast-feeding How should I use this medicine? This medicine is for injection into the hand or penis. It is given by a health care professional in a hospital or clinic setting. A special MedGuide will be given to you by the pharmacist with each prescription and refill. Be sure to read this information carefully each time. Talk to your pediatrician regarding the use of this medicine in children. Special care may be needed. Overdosage: If you think you have taken too much of this medicine contact a poison control center or emergency room at once. NOTE: This medicine is only for you. Do not share this medicine with others. What if I miss a dose? It is important not to miss your dose. Call your doctor or health care professional if you are unable to keep an appointment. What may interact with this medicine?  aspirin and aspirin-like medicines  certain medicines that treat or prevent blood clots like warfarin, enoxaparin, and dalteparin This list may not describe all possible interactions. Give your health care provider a list of all the medicines, herbs,  non-prescription drugs, or dietary supplements you use. Also tell them if you smoke, drink alcohol, or use illegal drugs. Some items may interact with your medicine. What should I watch for while using this medicine? Your condition will be monitored carefully while you are receiving this medicine. If being treated for Dupuytren's contracture, return to your healthcare provider the day after your hand is injected. In the meantime, do not flex or extend the fingers of your hand that was injected. Do not touch your finger that was injected, and elevate your hand until bedtime. Do not perform activity with the injected hand until you are told that it is OK. Follow any instructions about wearing a splint or performing finger exercises. Also, call your healthcare provider if you get increasing redness or swelling in the hand, if you have numbness or tingling in the treated finger, or if you have trouble bending the finger after the swelling goes down. If being treated for Peyronie's disease, you will need to return to your healthcare provider for a manual procedure that will stretch and help straighten your penis. Also, your healthcare provider will show you how to gently stretch your penis at home. Do not resume sexual activity until you are told that it is okay. Follow instructions on when to return for follow-up visits. Immediately call your doctor if you have trouble stretching or straightening your penis, or if you have pain or other concerns. Immediately call your healthcare provider if you get a fever or chills. What side effects may I notice from receiving this medicine? Side effects that you should report to your doctor or health   care professional as soon as possible:  allergic reactions like skin rash, itching or hives, swelling of the face, lips, or tongue  breathing problems  chest pain or palpitations  pain in your penis  pain when urinating  red or dark-brown urine  sudden loss of the  ability to maintain an erection  swelling of the injected hand  unusual swelling or bruising of the penis Side effects that usually do not require medical attention (report to your doctor or health care professional if they continue or are bothersome):  irritation at site where injected  pain at site where injected  unusual bleeding or bruising This list may not describe all possible side effects. Call your doctor for medical advice about side effects. You may report side effects to FDA at 1-800-FDA-1088. Where should I keep my medicine? This drug is given in a hospital or clinic and will not be stored at home. NOTE: This sheet is a summary. It may not cover all possible information. If you have questions about this medicine, talk to your doctor, pharmacist, or health care provider.  2020 Elsevier/Gold Standard (2015-06-08 09:34:13)  

## 2020-02-24 NOTE — Progress Notes (Signed)
Urological Symptom Review  Patient is experiencing the following symptoms: Get up at night to urinate Trouble starting stream Weak stream Penile pain (male only)    Review of Systems  Gastrointestinal (upper)  : Negative for upper GI symptoms  Gastrointestinal (lower) : Negative for lower GI symptoms  Constitutional : Night Sweats Fatigue  Skin: Negative for skin symptoms  Eyes: Negative for eye symptoms  Ear/Nose/Throat : Negative for Ear/Nose/Throat symptoms  Hematologic/Lymphatic: Negative for Hematologic/Lymphatic symptoms  Cardiovascular : Negative for cardiovascular symptoms  Respiratory : Negative for respiratory symptoms  Endocrine: Negative for endocrine symptoms  Musculoskeletal: Back pain Joint pain  Neurological: Negative for neurological symptoms  Psychologic: Negative for psychiatric symptoms

## 2020-02-24 NOTE — Progress Notes (Signed)
02/24/2020 10:31 AM   [name] Jun 20, 1960 [id number]  Referring provider: No referring provider defined for this encounter.  Peyronies disease  HPI: [name] is a 59yo here for followup for peyronies disease. The curvature has been stable for over 6 months. He is taking trental 400mg  BID. Penile pain. Based on pictures provided by the patient he has 45 degree dorsal curvature at the base and an hourglass deformity.    PMH: Past Medical History:  Diagnosis Date   Arthritis    Back injury    crushing   CAD (coronary artery disease)    COPD (chronic obstructive pulmonary disease) (HCC)    Emphysema/COPD (HCC)    H/O hernia repair    H/O right coronary artery stent placement 2007   History of stomach ulcers    HLD (hyperlipidemia)    Hypertension    Myocardial infarction (HCC) 2007, 2009   Pleurisy     Surgical History: Past Surgical History:  Procedure Laterality Date   ANTERIOR LATERAL LUMBAR FUSION WITH PERCUTANEOUS SCREW 1 LEVEL Left 12/13/2014   Procedure:  EXTREME LUMBAR INTERBODY FUSION LUMBAR THREE-FOUR,LEFT SIDED APPROACH WITH UNILATERAL PEDICLE SCREWS LUMBAR THREE-FOUR LEFT;LATERAL PLATE;  Surgeon: 12/15/2014, MD;  Location: [facility];  Service: Neurosurgery;  Laterality: Left;  left   APPENDECTOMY     BACK SURGERY     two prior; spinal rod   COLONOSCOPY     LUMBAR PERCUTANEOUS PEDICLE SCREW 1 LEVEL N/A 12/13/2014   Procedure: LUMBAR PERCUTANEOUS PEDICLE SCREW LUMBAR THREE-FOUR;  Surgeon: 12/15/2014, MD;  Location: [facility];  Service: Neurosurgery;  Laterality: N/A;   TONSILLECTOMY     TOOTH EXTRACTION      Home Medications:  Allergies as of 02/24/2020      Reactions   Apple Anaphylaxis   Throat swelling.    Morphine Other (See Comments)   bradycardia   Peppermint Flavor Hives, Itching      Medication List       Accurate as of February 24, 2020 10:31 AM. If you have any questions, ask your nurse or doctor.         albuterol 108 (90 Base) MCG/ACT inhaler Commonly known as: VENTOLIN HFA Inhale 1-2 puffs into the lungs every 6 (six) hours as needed for wheezing or shortness of breath.   amoxicillin-clavulanate 875-125 MG tablet Commonly known as: AUGMENTIN Take by mouth.   aspirin 81 MG tablet Take 81 mg by mouth daily.   atorvastatin 80 MG tablet Commonly known as: LIPITOR Take 1 tablet (80 mg total) by mouth daily.   baclofen 20 MG tablet Commonly known as: LIORESAL Take 20 mg by mouth every 8 (eight) hours.   clopidogrel 75 MG tablet Commonly known as: PLAVIX Take 75 mg by mouth daily.   gabapentin 600 MG tablet Commonly known as: NEURONTIN Take 900 mg by mouth 4 (four) times daily.   gabapentin 300 MG capsule Commonly known as: NEURONTIN   ibuprofen 600 MG tablet Commonly known as: ADVIL Take by mouth.   lidocaine 2 % solution Commonly known as: XYLOCAINE 30 mL by Mouth route Every three (3) hours. Apply to affected tooth for several minutes, then swish and spit   metoprolol tartrate 50 MG tablet Commonly known as: LOPRESSOR   nitroGLYCERIN 0.4 MG SL tablet Commonly known as: Nitrostat Place 1 tablet (0.4 mg total) under the tongue every 5 (five) minutes x 3 doses as needed for chest pain. If no relief after 3 rd dose, proceed  to the ED for an evalutaion.   oxyCODONE-acetaminophen 5-325 MG tablet Commonly known as: PERCOCET/ROXICET Take by mouth.   pentoxifylline 400 MG CR tablet Commonly known as: TRENTAL TAKE 1 TABLET BY MOUTH EVERY MORNING AND AT BEDTIME   traMADol 50 MG tablet Commonly known as: ULTRAM   traZODone 50 MG tablet Commonly known as: DESYREL Take 50 mg by mouth at bedtime as needed for sleep.       Allergies:  Allergies  Allergen Reactions   Apple Anaphylaxis    Throat swelling.    Morphine Other (See Comments)    bradycardia   Peppermint Flavor Hives and Itching    Family History: Family History  Problem Relation Age of  Onset   Coronary artery disease Unknown     Social History:  reports that he has been smoking cigarettes. He started smoking about 45 years ago. He has a 20.00 pack-year smoking history. He has never used smokeless tobacco. He reports that he does not drink alcohol and does not use drugs.  ROS: All other review of systems were reviewed and are negative except what is noted above in HPI  Physical Exam: BP 108/67    Pulse (!) 58    Temp 97.7 F (36.5 C)    Ht 6' (1.829 m)    Wt 158 lb (71.7 kg)    BMI 21.43 kg/m   Constitutional:  Alert and oriented, No acute distress. HEENT: [place] AT, moist mucus membranes.  Trachea midline, no masses. Cardiovascular: No clubbing, cyanosis, or edema. Respiratory: Normal respiratory effort, no increased work of breathing. GI: Abdomen is soft, nontender, nondistended, no abdominal masses GU: No CVA tenderness.  Lymph: No cervical or inguinal lymphadenopathy. Skin: No rashes, bruises or suspicious lesions. Neurologic: Grossly intact, no focal deficits, moving all 4 extremities. Psychiatric: Normal mood and affect.  Laboratory Data: Lab Results  Component Value Date   WBC 7.3 11/23/2017   HGB 14.7 11/23/2017   HCT 43.2 11/23/2017   MCV 92.7 11/23/2017   PLT 189 11/23/2017    Lab Results  Component Value Date   CREATININE 0.89 11/23/2017    No results found for: PSA  No results found for: TESTOSTERONE  No results found for: HGBA1C  Urinalysis No results found for: COLORURINE, APPEARANCEUR, LABSPEC, PHURINE, GLUCOSEU, HGBUR, BILIRUBINUR, KETONESUR, PROTEINUR, UROBILINOGEN, NITRITE, LEUKOCYTESUR  No results found for: LABMICR, WBCUA, RBCUA, LABEPIT, MUCUS, BACTERIA  Pertinent Imaging:  No results found for this or any previous visit.  No results found for this or any previous visit.  No results found for this or any previous visit.  No results found for this or any previous visit.  No results found for this or any previous  visit.  No results found for this or any previous visit.  No results found for this or any previous visit.  No results found for this or any previous visit.   Assessment & Plan:    1. Peyronie disease -We discussed the treatment options and the patient elects for xiaflex therapy. I will refer him to [facility]  - Urinalysis, Routine w reflex microscopic   No follow-ups on file.  Wilkie Aye, MD  [facility] [place]

## 2020-03-29 ENCOUNTER — Other Ambulatory Visit: Payer: Self-pay

## 2020-03-29 ENCOUNTER — Ambulatory Visit (INDEPENDENT_AMBULATORY_CARE_PROVIDER_SITE_OTHER): Payer: Medicare HMO | Admitting: Urology

## 2020-03-29 DIAGNOSIS — N486 Induration penis plastica: Secondary | ICD-10-CM

## 2020-03-29 MED ORDER — AMBULATORY NON FORMULARY MEDICATION: 3 refills | Status: AC

## 2020-03-29 NOTE — Progress Notes (Signed)
Pt came by today and completed

## 2020-05-01 ENCOUNTER — Other Ambulatory Visit: Payer: Self-pay | Admitting: Urology

## 2020-05-15 ENCOUNTER — Ambulatory Visit: Payer: Medicare HMO | Admitting: Urology

## 2020-05-16 ENCOUNTER — Ambulatory Visit: Payer: Medicare HMO | Admitting: Urology

## 2020-05-17 ENCOUNTER — Ambulatory Visit: Payer: Medicare HMO | Admitting: Urology

## 2020-05-18 ENCOUNTER — Ambulatory Visit: Payer: Medicare HMO | Admitting: Urology

## 2020-05-24 ENCOUNTER — Ambulatory Visit: Payer: Medicare HMO | Admitting: Urology

## 2020-07-31 ENCOUNTER — Other Ambulatory Visit: Payer: Self-pay | Admitting: Urology

## 2020-09-15 ENCOUNTER — Encounter: Payer: Self-pay | Admitting: Internal Medicine

## 2020-10-30 ENCOUNTER — Other Ambulatory Visit: Payer: Self-pay | Admitting: Urology

## 2020-12-12 NOTE — Progress Notes (Deleted)
Referring Provider:[facility] Primary Care Physician:  Smith Robert, MD Primary Gastroenterologist:  Dr. Marletta Lor  No chief complaint on file.   HPI:   [name] is a 60 y.o. male presenting today at the request of [facility] for weight loss.   Admitted at [facility] in March 2022 with acute pancreatitis.  CTA with mild diffuse main pancreatic duct dilation up to 4 mm diameter, increased from prior CT. Suggest short-term follow-up outpatient pancreas protocol MRI (preferred) or CT abdomen without and with IV contrast for further evaluation. Follow-up cross sectional imaging was not pursed inpatient. Abdominal US with 19mm gallbladder polyp, CBD normal, prominent pancreatic duct similar to prior CT.   Lipase elevated up to 7,655.  Triglycerides normal.  No hypercalcemia.  LFTs essentially normal. Minimally elevated AST at 41.   History of alcohol use greater than 10 years ago.  Etiology unknown. Intermittent weight loss and poor appetite over the past several months   Patient does actively smoke cigarettes, down to about half a pack per day but has been smoking for many years.  Today:    Past Medical History:  Diagnosis Date   Arthritis    Back injury    crushing   CAD (coronary artery disease)    COPD (chronic obstructive pulmonary disease) (HCC)    Emphysema/COPD (HCC)    H/O hernia repair    H/O right coronary artery stent placement 2007   History of stomach ulcers    HLD (hyperlipidemia)    Hypertension    Myocardial infarction (HCC) 2007, 2009   Pleurisy     Past Surgical History:  Procedure Laterality Date   ANTERIOR LATERAL LUMBAR FUSION WITH PERCUTANEOUS SCREW 1 LEVEL Left 12/13/2014   Procedure:  EXTREME LUMBAR INTERBODY FUSION LUMBAR THREE-FOUR,LEFT SIDED APPROACH WITH UNILATERAL PEDICLE SCREWS LUMBAR THREE-FOUR LEFT;LATERAL PLATE;  Surgeon: Aliene Beams, MD;  Location: [facility];  Service: Neurosurgery;  Laterality: Left;  left    APPENDECTOMY     BACK SURGERY     two prior; spinal rod   COLONOSCOPY     LUMBAR PERCUTANEOUS PEDICLE SCREW 1 LEVEL N/A 12/13/2014   Procedure: LUMBAR PERCUTANEOUS PEDICLE SCREW LUMBAR THREE-FOUR;  Surgeon: Aliene Beams, MD;  Location: [facility];  Service: Neurosurgery;  Laterality: N/A;   TONSILLECTOMY     TOOTH EXTRACTION      Current Outpatient Medications  Medication Sig Dispense Refill   albuterol (PROVENTIL HFA;VENTOLIN HFA) 108 (90 BASE) MCG/ACT inhaler Inhale 1-2 puffs into the lungs every 6 (six) hours as needed for wheezing or shortness of breath.     AMBULATORY NON FORMULARY MEDICATION Medication Name: Prostaglandin 43mcg/ml  To be given in office by MD 1 Syringe 3   aspirin 81 MG tablet Take 81 mg by mouth daily.     atorvastatin (LIPITOR) 80 MG tablet Take 1 tablet (80 mg total) by mouth daily. 30 tablet 6   baclofen (LIORESAL) 20 MG tablet Take 20 mg by mouth every 8 (eight) hours.  0   clopidogrel (PLAVIX) 75 MG tablet Take 75 mg by mouth daily.     gabapentin (NEURONTIN) 300 MG capsule      gabapentin (NEURONTIN) 600 MG tablet Take 900 mg by mouth 4 (four) times daily.      lidocaine (XYLOCAINE) 2 % solution 30 mL by Mouth route Every three (3) hours. Apply to affected tooth for several minutes, then swish and spit     metoprolol tartrate (LOPRESSOR) 50 MG tablet  nitroGLYCERIN (NITROSTAT) 0.4 MG SL tablet Place 1 tablet (0.4 mg total) under the tongue every 5 (five) minutes x 3 doses as needed for chest pain. If no relief after 3 rd dose, proceed to the ED for an evalutaion. 25 tablet 0   pentoxifylline (TRENTAL) 400 MG CR tablet TAKE 1 TABLET BY MOUTH EVERY MORNING AND AT BEDTIME 60 tablet 2   traMADol (ULTRAM) 50 MG tablet      traZODone (DESYREL) 50 MG tablet Take 50 mg by mouth at bedtime as needed for sleep.      No current facility-administered medications for this visit.    Allergies as of 12/13/2020 - Review Complete 02/24/2020  Allergen Reaction  Noted   Apple Anaphylaxis 01/31/2014   Morphine Other (See Comments) 01/01/2018   Peppermint flavor Hives and Itching 01/31/2014    Family History  Problem Relation Age of Onset   Coronary artery disease Unknown     Social History   Socioeconomic History   Marital status: Married    Spouse name: Not on file   Number of children: Not on file   Years of education: Not on file   Highest education level: Not on file  Occupational History   Not on file  Tobacco Use   Smoking status: Some Days    Packs/day: 0.50    Years: 40.00    Pack years: 20.00    Types: Cigarettes    Start date: 03/15/1974   Smokeless tobacco: Never   Tobacco comments:    maybe 5 per week   Vaping Use   Vaping Use: Never used  Substance and Sexual Activity   Alcohol use: No    Alcohol/week: 0.0 standard drinks   Drug use: No   Sexual activity: Yes    Partners: Female  Other Topics Concern   Not on file  Social History Narrative   Not on file   Social Determinants of Health   Financial Resource Strain: Not on file  Food Insecurity: Not on file  Transportation Needs: Not on file  Physical Activity: Not on file  Stress: Not on file  Social Connections: Not on file  Intimate Partner Violence: Not on file    Review of Systems: Gen: Denies any fever, chills, fatigue, weight loss, lack of appetite.  CV: Denies chest pain, heart palpitations, peripheral edema, syncope.  Resp: Denies shortness of breath at rest or with exertion. Denies wheezing or cough.  GI: Denies dysphagia or odynophagia. Denies jaundice, hematemesis, fecal incontinence. GU : Denies urinary burning, urinary frequency, urinary hesitancy MS: Denies joint pain, muscle weakness, cramps, or limitation of movement.  Derm: Denies rash, itching, dry skin Psych: Denies depression, anxiety, memory loss, and confusion Heme: Denies bruising, bleeding, and enlarged lymph nodes.  Physical Exam: There were no vitals taken for this  visit. General:   Alert and oriented. Pleasant and cooperative. Well-nourished and well-developed.  Head:  Normocephalic and atraumatic. Eyes:  Without icterus, sclera clear and conjunctiva pink.  Ears:  Normal auditory acuity. Nose:  No deformity, discharge,  or lesions. Mouth:  No deformity or lesions, oral mucosa pink.  Neck:  Supple, without mass or thyromegaly. Lungs:  Clear to auscultation bilaterally. No wheezes, rales, or rhonchi. No distress.  Heart:  S1, S2 present without murmurs appreciated.  Abdomen:  +BS, soft, non-tender and non-distended. No HSM noted. No guarding or rebound. No masses appreciated.  Rectal:  Deferred  Msk:  Symmetrical without gross deformities. Normal posture. Pulses:  Normal pulses noted. Extremities:  Without clubbing or edema. Neurologic:  Alert and  oriented x4;  grossly normal neurologically. Skin:  Intact without significant lesions or rashes. Cervical Nodes:  No significant cervical adenopathy. Psych:  Alert and cooperative. Normal mood and affect.

## 2020-12-13 ENCOUNTER — Ambulatory Visit: Payer: Medicare HMO | Admitting: Gastroenterology

## 2020-12-13 ENCOUNTER — Encounter: Payer: Self-pay | Admitting: Internal Medicine

## 2021-01-26 ENCOUNTER — Other Ambulatory Visit: Payer: Self-pay | Admitting: Urology

## 2023-05-29 ENCOUNTER — Emergency Department (HOSPITAL_COMMUNITY): Payer: 59

## 2023-05-29 ENCOUNTER — Encounter (HOSPITAL_COMMUNITY): Payer: Self-pay

## 2023-05-29 ENCOUNTER — Emergency Department (HOSPITAL_COMMUNITY)
Admission: EM | Admit: 2023-05-29 | Discharge: 2023-05-30 | Disposition: A | Discharge status: Home / Self Care | Payer: 59 | Attending: Emergency Medicine | Admitting: Emergency Medicine

## 2023-05-29 ENCOUNTER — Other Ambulatory Visit: Payer: Self-pay

## 2023-05-29 DIAGNOSIS — R0789 Other chest pain: Secondary | ICD-10-CM | POA: Insufficient documentation

## 2023-05-29 DIAGNOSIS — J449 Chronic obstructive pulmonary disease, unspecified: Secondary | ICD-10-CM | POA: Insufficient documentation

## 2023-05-29 DIAGNOSIS — I2510 Atherosclerotic heart disease of native coronary artery without angina pectoris: Secondary | ICD-10-CM | POA: Insufficient documentation

## 2023-05-29 DIAGNOSIS — I10 Essential (primary) hypertension: Secondary | ICD-10-CM | POA: Diagnosis not present

## 2023-05-29 DIAGNOSIS — K209 Esophagitis, unspecified without bleeding: Secondary | ICD-10-CM | POA: Diagnosis not present

## 2023-05-29 DIAGNOSIS — R1013 Epigastric pain: Secondary | ICD-10-CM | POA: Diagnosis present

## 2023-05-29 DIAGNOSIS — D72829 Elevated white blood cell count, unspecified: Secondary | ICD-10-CM | POA: Diagnosis not present

## 2023-05-29 DIAGNOSIS — F17200 Nicotine dependence, unspecified, uncomplicated: Secondary | ICD-10-CM | POA: Diagnosis not present

## 2023-05-29 LAB — CBC WITH DIFFERENTIAL/PLATELET
Abs Immature Granulocytes: 0.03 10*3/uL (ref 0.00–0.07)
Basophils Absolute: 0 10*3/uL (ref 0.0–0.1)
Basophils Relative: 0 %
Eosinophils Absolute: 0.2 10*3/uL (ref 0.0–0.5)
Eosinophils Relative: 1 %
HCT: 47.4 % (ref 39.0–52.0)
Hemoglobin: 16 g/dL (ref 13.0–17.0)
Immature Granulocytes: 0 %
Lymphocytes Relative: 35 %
Lymphs Abs: 3.8 10*3/uL (ref 0.7–4.0)
MCH: 31.2 pg (ref 26.0–34.0)
MCHC: 33.8 g/dL (ref 30.0–36.0)
MCV: 92.4 fL (ref 80.0–100.0)
Monocytes Absolute: 0.9 10*3/uL (ref 0.1–1.0)
Monocytes Relative: 8 %
Neutro Abs: 6.1 10*3/uL (ref 1.7–7.7)
Neutrophils Relative %: 56 %
Platelets: 212 10*3/uL (ref 150–400)
RBC: 5.13 MIL/uL (ref 4.22–5.81)
RDW: 13.2 % (ref 11.5–15.5)
WBC: 11 10*3/uL — ABNORMAL HIGH (ref 4.0–10.5)
nRBC: 0 % (ref 0.0–0.2)

## 2023-05-29 LAB — I-STAT CHEM 8, ED
BUN: 17 mg/dL (ref 8–23)
Calcium, Ion: 1.18 mmol/L (ref 1.15–1.40)
Chloride: 107 mmol/L (ref 98–111)
Creatinine, Ser: 1 mg/dL (ref 0.61–1.24)
Glucose, Bld: 108 mg/dL — ABNORMAL HIGH (ref 70–99)
HCT: 48 % (ref 39.0–52.0)
Hemoglobin: 16.3 g/dL (ref 13.0–17.0)
Potassium: 4.3 mmol/L (ref 3.5–5.1)
Sodium: 143 mmol/L (ref 135–145)
TCO2: 24 mmol/L (ref 22–32)

## 2023-05-29 LAB — COMPREHENSIVE METABOLIC PANEL
ALT: 20 U/L (ref 0–44)
AST: 26 U/L (ref 15–41)
Albumin: 3.8 g/dL (ref 3.5–5.0)
Alkaline Phosphatase: 74 U/L (ref 38–126)
Anion gap: 8 (ref 5–15)
BUN: 13 mg/dL (ref 8–23)
CO2: 23 mmol/L (ref 22–32)
Calcium: 10 mg/dL (ref 8.9–10.3)
Chloride: 109 mmol/L (ref 98–111)
Creatinine, Ser: 1.14 mg/dL (ref 0.61–1.24)
GFR, Estimated: >60 mL/min (ref >60)
Glucose, Bld: 109 mg/dL — ABNORMAL HIGH (ref 70–99)
Potassium: 4.5 mmol/L (ref 3.5–5.1)
Sodium: 140 mmol/L (ref 135–145)
Total Bilirubin: 0.6 mg/dL (ref 0.0–1.2)
Total Protein: 6.8 g/dL (ref 6.5–8.1)

## 2023-05-29 LAB — LIPASE, BLOOD: Lipase: 43 U/L (ref 11–51)

## 2023-05-29 LAB — TROPONIN I (HIGH SENSITIVITY): Troponin I (High Sensitivity): 5 ng/L (ref <18)

## 2023-05-29 MED ORDER — IOHEXOL 350 MG/ML SOLN
75.0000 mL | Freq: Once | INTRAVENOUS | Status: AC | PRN
  Administered 2023-05-29: 80 mL via INTRAVENOUS

## 2023-05-29 MED ORDER — PANTOPRAZOLE SODIUM 40 MG IV SOLR
40.0000 mg | Freq: Once | INTRAVENOUS | Status: AC
  Administered 2023-05-29: 40 mg via INTRAVENOUS
  Filled 2023-05-29: qty 10

## 2023-05-29 MED ORDER — SODIUM CHLORIDE 0.9 % IV BOLUS
500.0000 mL | Freq: Once | INTRAVENOUS | Status: AC
  Administered 2023-05-29: 500 mL via INTRAVENOUS

## 2023-05-29 MED ORDER — LIDOCAINE VISCOUS HCL 2 % MT SOLN
15.0000 mL | Freq: Once | OROMUCOSAL | Status: AC
  Administered 2023-05-29: 15 mL via ORAL
  Filled 2023-05-29: qty 15

## 2023-05-29 MED ORDER — ALUM & MAG HYDROXIDE-SIMETH 200-200-20 MG/5ML PO SUSP
30.0000 mL | Freq: Once | ORAL | Status: AC
  Administered 2023-05-29: 30 mL via ORAL
  Filled 2023-05-29: qty 30

## 2023-05-29 MED ORDER — FENTANYL CITRATE PF 50 MCG/ML IJ SOSY: 100.0000 ug | PREFILLED_SYRINGE | Freq: Once | INTRAMUSCULAR | Status: DC

## 2023-05-29 MED ORDER — FENTANYL CITRATE PF 50 MCG/ML IJ SOSY
100.0000 ug | PREFILLED_SYRINGE | Freq: Once | INTRAMUSCULAR | Status: AC
  Administered 2023-05-29: 100 ug via INTRAVENOUS
  Filled 2023-05-29: qty 2

## 2023-05-29 MED ORDER — PANTOPRAZOLE SODIUM 40 MG PO TBEC: 40.0000 mg | DELAYED_RELEASE_TABLET | Freq: Every day | ORAL | 0 refills | Status: AC

## 2023-05-29 NOTE — ED Provider Notes (Signed)
 Emergency Department Provider Note   I have reviewed the triage vital signs and the nursing notes.   HISTORY  Chief Complaint Chest Pain   HPI [name] is a 63 y.o. male past history of CAD, COPD, hypertension presents to the emergency department with acute onset severe mid epigastric pain radiating into the chest.  He feels associated pain into his back.  Pain began suddenly while walking upstairs.  He denies any lifting or straining.  No shortness of breath.  No hematemesis or diarrhea.  Past Medical History:  Diagnosis Date   Arthritis    Back injury    crushing   CAD (coronary artery disease)    COPD (chronic obstructive pulmonary disease) (HCC)    Emphysema/COPD (HCC)    H/O hernia repair    H/O right coronary artery stent placement 2007   History of stomach ulcers    HLD (hyperlipidemia)    Hypertension    Myocardial infarction (HCC) 2007, 2009   Pleurisy     Review of Systems  Constitutional: No fever/chills Cardiovascular: Positive chest pain. Respiratory: Denies shortness of breath. Gastrointestinal: Epigastric abdominal pain.  No nausea, no vomiting.  No diarrhea.  Musculoskeletal: Negative for back pain. Skin: Negative for rash. Neurological: Negative for headaches.   ____________________________________________   PHYSICAL EXAM:  VITAL SIGNS: Vitals:   05/30/23 0015 05/30/23 0122  BP:  137/89  Pulse: (!) 56 61  Resp: 10 16  Temp:  97.9 F (36.6 C)  SpO2: 97% 100%    Constitutional: Alert. Patient arrives writhing in bed. Appears very uncomfortable.  Eyes: Conjunctivae are normal.  Head: Atraumatic. Nose: No congestion/rhinnorhea. Mouth/Throat: Mucous membranes are moist.  Neck: No stridor.   Cardiovascular: Normal rate, regular rhythm. Good peripheral circulation. Grossly normal heart sounds.   Respiratory: Normal respiratory effort.  No retractions. Lungs CTAB. Gastrointestinal: Soft with mostly tenderness in the epigastric  and mid-abdominal region. No peritonitis. No distention.  Musculoskeletal: No lower extremity tenderness nor edema. No gross deformities of extremities. Neurologic:  Normal speech and language.  Skin:  Skin is warm, dry and intact. No rash noted.  ____________________________________________   LABS (all labs ordered are listed, but only abnormal results are displayed)  Labs Reviewed  COMPREHENSIVE METABOLIC PANEL - Abnormal; Notable for the following components:      Result Value   Glucose, Bld 109 (*)    All other components within normal limits  CBC WITH DIFFERENTIAL/PLATELET - Abnormal; Notable for the following components:   WBC 11.0 (*)    All other components within normal limits  I-STAT CHEM 8, ED - Abnormal; Notable for the following components:   Glucose, Bld 108 (*)    All other components within normal limits  LIPASE, BLOOD  TROPONIN I (HIGH SENSITIVITY)  TROPONIN I (HIGH SENSITIVITY)   ____________________________________________  EKG   EKG Interpretation Date/Time:  Thursday May 29 2023 20:33:38 EST Ventricular Rate:  88 PR Interval:  141 QRS Duration:  88 QT Interval:  380 QTC Calculation: 460 R Axis:   96  Text Interpretation: Sinus rhythm Right axis deviation Minimal ST depression, inferior leads Similar to prior Confirmed by Darra Chew [phone]) on 05/29/2023 8:57:42 PM        ____________________________________________  RADIOLOGY  CT Angio Chest/Abd/Pel for Dissection W and/or Wo Contrast Result Date: 05/29/2023 CLINICAL DATA:  Chest pain.  Acute aortic syndrome suspected EXAM: CT ANGIOGRAPHY CHEST, ABDOMEN AND PELVIS TECHNIQUE: Non-contrast CT of the chest was initially obtained. Multidetector CT imaging through  the chest, abdomen and pelvis was performed using the standard protocol during bolus administration of intravenous contrast. Multiplanar reconstructed images and MIPs were obtained and reviewed to evaluate the vascular anatomy. RADIATION  DOSE REDUCTION: This exam was performed according to the departmental dose-optimization program which includes automated exposure control, adjustment of the mA and/or kV according to patient size and/or use of iterative reconstruction technique. CONTRAST:  80mL OMNIPAQUE  IOHEXOL  350 MG/ML SOLN COMPARISON:  Chest radiograph 05/29/2023 and CT chest 11/23/2017 FINDINGS: CTA CHEST FINDINGS Cardiovascular: No intramural hematoma, penetrating atherosclerotic ulcer, aneurysm or dissection in the thoracic aorta. Aortic and coronary artery atherosclerotic calcification. Normal heart size. No pericardial effusion. CABG. Mediastinum/Nodes: Trachea is unremarkable. Circumferential esophageal wall thickening of the distal esophagus. No thoracic adenopathy. Lungs/Pleura: Emphysema. Bullous change in the apices. Diffuse bronchial wall thickening. Scattered mucous plugging in the lower lungs. No focal consolidation, pleural effusion, or pneumothorax. Musculoskeletal: No acute fracture.  Sternotomy. Review of the MIP images confirms the above findings. CTA ABDOMEN AND PELVIS FINDINGS VASCULAR Aorta: Normal caliber abdominal aorta with scattered atherosclerotic plaque. No hemodynamically significant stenosis. No aneurysm or dissection. Celiac: Mild plaque at the origin without hemodynamically significant stenosis. No aneurysm or dissection. SMA: Normal variant replaced right hepatic artery arising from the celiac axis. No hemodynamically significant stenosis, aneurysm, or dissection. Renals: No hemodynamically significant stenosis, aneurysm, or dissection. IMA: Patent. Inflow: Irregular noncalcified plaque about the left common iliac artery causes mild narrowing. No aneurysm or dissection in the inflow arteries. Veins: No obvious venous abnormality within the limitations of this arterial phase study. Review of the MIP images confirms the above findings. NON-VASCULAR Hepatobiliary: No acute abnormality. Pancreas: Unremarkable. Spleen:  Unremarkable. Adrenals/Urinary Tract: Stable adrenal glands. No urinary calculi or hydronephrosis. Unremarkable bladder. Stomach/Bowel: Stool ball in the rectum. No bowel obstruction or bowel wall thickening. Stomach is within normal limits. Lymphatic: No lymphadenopathy. Reproductive: No acute abnormality. Other: No free intraperitoneal fluid or air. Musculoskeletal: Postoperative change about the lumbosacral spine. No acute fracture. Review of the MIP images confirms the above findings. IMPRESSION: 1. No acute aortic syndrome. 2. Circumferential esophageal wall thickening of the distal esophagus, which can be seen with esophagitis. Consider endoscopy to exclude neoplasm. Aortic Atherosclerosis (ICD10-I70.0) and Emphysema (ICD10-J43.9). Electronically Signed   By: Norman Gatlin M.D.   On: 05/29/2023 21:02   DG Chest Portable 1 View Result Date: 05/29/2023 CLINICAL DATA:  Chest pain EXAM: PORTABLE CHEST 1 VIEW COMPARISON:  11/23/2017 FINDINGS: Prior CABG. Heart and mediastinal contours are within normal limits. No focal opacities or effusions. No acute bony abnormality. IMPRESSION: No active disease. Electronically Signed   By: Franky Crease M.D.   On: 05/29/2023 20:33    ____________________________________________   PROCEDURES  Procedure(s) performed:   Procedures  None  ____________________________________________   INITIAL IMPRESSION / ASSESSMENT AND PLAN / ED COURSE  Pertinent labs & imaging results that were available during my care of the patient were reviewed by me and considered in my medical decision making (see chart for details).   This patient is Presenting for Evaluation of abdominal pain, which does require a range of treatment options, and is a complaint that involves a high risk of morbidity and mortality.  The Differential Diagnoses includes but is not exclusive to acute coronary syndrome, aortic dissection, pulmonary embolism, cardiac tamponade, community-acquired  pneumonia, pericarditis, musculoskeletal chest wall pain, etc.   Critical Interventions-    Medications  fentaNYL  (SUBLIMAZE ) injection 100 mcg (100 mcg Intravenous Given 05/29/23 2023)  sodium chloride  0.9 %  bolus 500 mL (0 mLs Intravenous Stopped 05/30/23 0130)  iohexol  (OMNIPAQUE ) 350 MG/ML injection 75 mL (80 mLs Intravenous Contrast Given 05/29/23 2047)  pantoprazole  (PROTONIX ) injection 40 mg (40 mg Intravenous Given 05/29/23 2255)  alum & mag hydroxide-simeth (MAALOX/MYLANTA) 200-200-20 MG/5ML suspension 30 mL (30 mLs Oral Given 05/29/23 2255)    And  lidocaine  (XYLOCAINE ) 2 % viscous mouth solution 15 mL (15 mLs Oral Given 05/29/23 2255)    Reassessment after intervention: pain resolved.    I did obtain Additional Historical Information from EMS.    Clinical Laboratory Tests Ordered, included troponin normal at 5.  Mild leukocytosis to 11.  No acute kidney injury.  LFTs, bilirubin, lipase normal.  Radiologic Tests Ordered, included CXR and CTA dissection. I independently interpreted the images and agree with radiology interpretation.   Cardiac Monitor Tracing which shows NSR.    Social Determinants of Health Risk patient is a smoker.   Medical Decision Making: Summary:  Patient presents to the emergency department with acute onset epigastric abdominal pain radiating into the chest.  Differential is broad upon arrival and patient appears very uncomfortable.  He was sent urgently for CT dissection scan as he does have history of elevated BP, smoking.  EKG not consistent with STEMI.  Reevaluation with update and discussion with patient.  On reassessment he is looking well.  CTA shows likely esophagitis without evidence of perforation.  He is currently pain-free.  Plan for troponin trending but overall feel that his pain is likely explained by his esophagitis.  Will place referral for GI for outpatient upper endoscopy.  Troponin trend pending. Care transferred to Dr. Bari.   Considered  admission but workup reassuring for non-cardiac etiology of pain.   Patient's presentation is most consistent with acute presentation with potential threat to life or bodily function.   Disposition: pending  ____________________________________________  FINAL CLINICAL IMPRESSION(S) / ED DIAGNOSES  Final diagnoses:  Atypical chest pain  Esophagitis     NEW OUTPATIENT MEDICATIONS STARTED DURING THIS VISIT:  Discharge Medication List as of 05/30/2023  1:12 AM     START taking these medications   Details  pantoprazole  (PROTONIX ) 40 MG tablet Take 1 tablet (40 mg total) by mouth daily., Starting Thu 05/29/2023, Normal        Note:  This document was prepared using Dragon voice recognition software and may include unintentional dictation errors.  Fonda Law, MD, [facility]    Averianna Brugger, Fonda MATSU, MD 05/30/23 [phone]

## 2023-05-29 NOTE — Discharge Instructions (Addendum)
 You have been seen in the Emergency Department (ED) for abdominal pain.  Your evaluation showed inflammation in your esophagus. We are starting you on an acid reducing medication but you need to see a gastroenterologist ASAP. I have placed a referral to help with this but you should call the office and ask your PCP to help if you have any issues.   Please follow up as instructed above regarding today's emergent visit and the symptoms that are bothering you.  Return to the ED if your abdominal pain worsens or fails to improve, you develop bloody vomiting, bloody diarrhea, you are unable to tolerate fluids due to vomiting, fever greater than 101, or other symptoms that concern you.

## 2023-05-29 NOTE — ED Triage Notes (Signed)
 Pt bib [facility] coming from home. Pt reported he was walking up stairs when he started having CP. Sharp chest pain mid/left side, left arm, SOB. Pt reports these symptoms started at 1900. Pt reports a ripping sensation to chest radiating to back. Pt has cardiac hx - CABG/ Stent. Pt does take aspirin.   EMS vital signs: 18 LAC 324 aspirin given 3 nitroglycerin  2mg  morphine  144/103 110 Sinus Tach  500 NS given

## 2023-05-30 DIAGNOSIS — K209 Esophagitis, unspecified without bleeding: Secondary | ICD-10-CM | POA: Diagnosis not present

## 2023-05-30 LAB — TROPONIN I (HIGH SENSITIVITY): Troponin I (High Sensitivity): 6 ng/L (ref <18)

## 2023-05-30 NOTE — ED Provider Notes (Signed)
 Patient signed out pending repeat troponin.  Repeat troponin is 6.  Will be discharged home per Dr. Edwin Dada discharge instructions.   Shon Baton, MD 05/30/23 [phone]

## 2023-05-30 NOTE — ED Notes (Signed)
 Pt in no acute distress at time of discharge. Respirations regular, unlabored. Pt alert & oriented x4, ambulatory at time of discharge.

## 2023-07-14 ENCOUNTER — Encounter: Payer: Self-pay | Admitting: Emergency Medicine
